# Patient Record
Sex: Male | Born: 1970 | Race: White | Hispanic: No | Marital: Married | Smoking: Current every day smoker
Health system: Southern US, Community
[De-identification: ages and names within clinical notes are randomized; demographics above are authoritative.]

## PROBLEM LIST (undated history)

## (undated) DIAGNOSIS — I10 Essential (primary) hypertension: Secondary | ICD-10-CM

## (undated) HISTORY — PX: TENDON REPAIR: SHX5111

## (undated) HISTORY — DX: Essential (primary) hypertension: I10

## (undated) HISTORY — PX: WISDOM TOOTH EXTRACTION: SHX21

---

## 2020-11-13 ENCOUNTER — Ambulatory Visit: Payer: Self-pay | Admitting: Internal Medicine

## 2020-11-28 ENCOUNTER — Other Ambulatory Visit: Payer: Self-pay

## 2020-11-28 ENCOUNTER — Encounter: Payer: Self-pay | Admitting: Internal Medicine

## 2020-11-28 ENCOUNTER — Ambulatory Visit (INDEPENDENT_AMBULATORY_CARE_PROVIDER_SITE_OTHER): Payer: Self-pay | Admitting: Internal Medicine

## 2020-11-28 DIAGNOSIS — F17200 Nicotine dependence, unspecified, uncomplicated: Secondary | ICD-10-CM

## 2020-12-15 ENCOUNTER — Encounter: Payer: Self-pay | Admitting: Internal Medicine

## 2021-06-11 ENCOUNTER — Telehealth (INDEPENDENT_AMBULATORY_CARE_PROVIDER_SITE_OTHER): Payer: Self-pay | Admitting: Family

## 2021-06-11 ENCOUNTER — Encounter: Payer: Self-pay | Admitting: Family

## 2021-06-11 ENCOUNTER — Ambulatory Visit: Payer: Self-pay | Admitting: Family

## 2021-06-11 VITALS — Ht 69.0 in | Wt 200.0 lb

## 2021-06-11 DIAGNOSIS — R222 Localized swelling, mass and lump, trunk: Secondary | ICD-10-CM

## 2021-06-11 DIAGNOSIS — Z1211 Encounter for screening for malignant neoplasm of colon: Secondary | ICD-10-CM | POA: Insufficient documentation

## 2021-06-11 DIAGNOSIS — K429 Umbilical hernia without obstruction or gangrene: Secondary | ICD-10-CM | POA: Insufficient documentation

## 2021-06-11 DIAGNOSIS — F17210 Nicotine dependence, cigarettes, uncomplicated: Secondary | ICD-10-CM | POA: Insufficient documentation

## 2021-06-11 NOTE — Assessment & Plan Note (Signed)
Avoid heavy straining.  ?Referral to gen surgery. ?Patient education sent to mychart. ?

## 2021-06-11 NOTE — Assessment & Plan Note (Signed)
Referral for GI placed for colonoscopy.  ?

## 2021-06-11 NOTE — Assessment & Plan Note (Signed)
Suspected lipoma, was virtual visit, will assess at physical exam.  ?Pt states no signs of infection, advised to let me know if these begin to occur.  ?

## 2021-06-11 NOTE — Progress Notes (Signed)
? ? ? ?New Patient MyChart Video Visit ? ? ? ?Virtual Visit via Video Note  ? ?This visit type was conducted due to national recommendations for restrictions regarding the COVID-19 Pandemic (e.g. social distancing) in an effort to limit this patient's exposure and mitigate transmission in our community. This patient is at least at moderate risk for complications without adequate follow up. This format is felt to be most appropriate for this patient at this time. Physical exam was limited by quality of the video and audio technology used for the visit. CMA was able to get the patient set up on a video visit. ? ?Patient location: Home. Patient and provider in visit ?Provider location: Office ? ?I discussed the limitations of evaluation and management by telemedicine and the availability of in person appointments. The patient expressed understanding and agreed to proceed. ? ?Visit Date: 06/11/2021 ? ?Today's healthcare provider: Eugenia Pancoast, FNP  ? ? ?Subjective:  ?Patient ID: Marcus Holmes, male    DOB: 1970-11-13  Age: 51 y.o. MRN: [id number] ? ?CC:  ?Chief Complaint  ?Patient presents with  ? Establish Care  ? ? ?HPI ?Marcus Holmes is here to establish care as a new patient via video visit. ? ?Has not had a PCP in the last few years.  ?He recently acquired insurance and lives right across the street.  ?Pt is with acute concerns ? ?Has noticed umbilical hernia, non-tender, and states he is able to push it back in. If he moves around or strains he notices that it pops back out again.  ? ?Also with c/o soft tissue 'mass' on his lower back but doesn't cause him any concern or any pain. He states it feels squishy. Has been there for about six months. No redness, just protrudes. Notices it in the shower.  ? ?Dentist: has been seen in the last one year. Due for f/u for his next renewal.  ? ?Colonoscopy: has never had in the past.   ? ?History reviewed. No pertinent past medical  history. ? ?History reviewed. No pertinent surgical history. ? ?Family History  ?Problem Relation Age of Onset  ? Healthy Mother   ? Lung cancer Father   ?     smoker  ? Heart attack Father 44  ?     poor circulation  ? Healthy Brother   ? Heart disease Paternal Grandfather   ?     cardiomegaly  ? ? ?Social History  ? ?Socioeconomic History  ? Marital status: Married  ?  Spouse name: Marcus Holmes  ? Number of children: 0  ? Years of education: Not on file  ? Highest education level: Not on file  ?Occupational History  ? Occupation: Software engineer  ?Tobacco Use  ? Smoking status: Every Day  ?  Packs/day: 0.50  ?  Years: 35.00  ?  Pack years: 17.50  ?  Types: Cigarettes  ? Smokeless tobacco: Never  ?Vaping Use  ? Vaping Use: Never used  ?Substance and Sexual Activity  ? Alcohol use: Yes  ?  Comment: occasion  ? Drug use: Never  ? Sexual activity: Yes  ?  Partners: Female  ?  Birth control/protection: None  ?Other Topics Concern  ? Not on file  ?Social History Narrative  ? Not on file  ? ?Social Determinants of Health  ? ?Financial Resource Strain: Not on file  ?Food Insecurity: Not on file  ?Transportation Needs: Not on file  ?Physical Activity: Not on file  ?Stress: Not on file  ?  Social Connections: Not on file  ?Intimate Partner Violence: Not on file  ? ? ?No outpatient medications prior to visit.  ? ?No facility-administered medications prior to visit.  ? ? ?No Known Allergies ? ?ROS ?Review of Systems  ?Constitutional:  Negative for chills, fatigue, fever and unexpected weight change.  ?Eyes:  Negative for visual disturbance.  ?Respiratory:  Negative for shortness of breath.   ?Cardiovascular:  Negative for chest pain.  ?Gastrointestinal:  Negative for abdominal pain.  ?Genitourinary:  Negative for difficulty urinating.  ?Skin:  Negative for rash.  ?Neurological:  Negative for dizziness and headaches.  ? ? ?  ?Objective:  ?  ?Physical Exam ?Constitutional:   ?   General: He is not in acute distress. ?   Appearance: Normal  appearance. He is not ill-appearing, toxic-appearing or diaphoretic.  ?HENT:  ?   Head: Normocephalic.  ?Pulmonary:  ?   Effort: Pulmonary effort is normal.  ?Neurological:  ?   General: No focal deficit present.  ?   Mental Status: He is alert and oriented to person, place, and time.  ?Psychiatric:     ?   Mood and Affect: Mood normal.     ?   Behavior: Behavior normal.     ?   Thought Content: Thought content normal.     ?   Judgment: Judgment normal.  ? ? ? ? ?Ht 5' 9"  (1.753 m)   Wt 200 lb (90.7 kg)   BMI 29.53 kg/m?  ?Wt Readings from Last 3 Encounters:  ?06/11/21 200 lb (90.7 kg)  ? ? ? ?Health Maintenance Due  ?Topic Date Due  ? HIV Screening  Never done  ? TETANUS/TDAP  Never done  ? COLONOSCOPY (Pts 45-38yr Insurance coverage will need to be confirmed)  Never done  ? Zoster Vaccines- Shingrix (1 of 2) Never done  ? ? ?There are no preventive care reminders to display for this patient. ? ?No results found for: TSH ?No results found for: WBC, HGB, HCT, MCV, PLT ?No results found for: NA, K, CHLORIDE, CO2, GLUCOSE, BUN, CREATININE, BILITOT, ALKPHOS, AST, ALT, PROT, ALBUMIN, CALCIUM, ANIONGAP, EGFR, GFR ?No results found for: CHOL ?No results found for: HDL ?No results found for: [place]?No results found for: TRIG ?No results found for: CHOLHDL ?No results found for: HGBA1C ? ?  ?Assessment & Plan:  ? ?Problem List Items Addressed This Visit   ? ?  ? Other  ? Umbilical hernia without obstruction and without gangrene - Primary  ?  Avoid heavy straining.  ?Referral to gen surgery. ?Patient education sent to mychart. ?  ?  ? Relevant Orders  ? Ambulatory referral to General Surgery  ? Ambulatory Referral for Lung Cancer Scre  ? Cigarette smoker  ?  Pt does not wish to smoke at this time.  ?Referring to lung cancer screening clinic.  ?  ?  ? Relevant Orders  ? Ambulatory Referral for Lung Cancer Scre  ? Screening for malignant neoplasm of colon  ?  Referral for GI placed for colonoscopy.  ?  ?  ? Relevant  Orders  ? Ambulatory referral to Gastroenterology  ? Mass of subcutaneous tissue of back  ?  Suspected lipoma, was virtual visit, will assess at physical exam.  ?Pt states no signs of infection, advised to let me know if these begin to occur.  ?  ?  ? ? ?No orders of the defined types were placed in this encounter. ? ? ?Follow-up: Return in about  1 month (around 07/12/2021) for follow up for CPE in office, come fasting.  ? ? ?Eugenia Pancoast, FNP ?

## 2021-06-11 NOTE — Assessment & Plan Note (Signed)
Pt does not wish to smoke at this time.  ?Referring to lung cancer screening clinic.  ?

## 2021-06-11 NOTE — Patient Instructions (Addendum)
A referral was placed today for general surgery in regards to your hernia to general surgery, a referral to GI for a colonoscopy as well as a referral to the lung screening clinic for annual CT scans to assess for lung cancer.  ? ?Please let us know if you have not heard back within 1 week about your referral. ? ?Try to avoid any heavy straining with your hernia.  ? ?Please schedule a follow up fasting for your complete physical.  ? ?It was a pleasure seeing you today! Please do not hesitate to reach out with any questions and or concerns. ? ?Regards,  ? ?Ivette Castronova ?FNP-C ? ? ?

## 2021-06-12 ENCOUNTER — Telehealth: Payer: Self-pay

## 2021-06-12 NOTE — Telephone Encounter (Signed)
CALLED PATIENT NO ANSWER LEFT VOICEMAIL FOR A CALL BACK ? ?

## 2021-06-13 ENCOUNTER — Telehealth: Payer: Self-pay

## 2021-06-13 NOTE — Telephone Encounter (Signed)
CALLED PATIENT NO ANSWER LEFT VOICEMAIL FOR A CALL BACK °Letter sent °

## 2021-06-19 ENCOUNTER — Telehealth: Payer: Self-pay

## 2021-06-19 ENCOUNTER — Other Ambulatory Visit: Payer: Self-pay

## 2021-06-19 ENCOUNTER — Ambulatory Visit (INDEPENDENT_AMBULATORY_CARE_PROVIDER_SITE_OTHER): Payer: 59 | Admitting: Surgery

## 2021-06-19 ENCOUNTER — Encounter: Payer: Self-pay | Admitting: Surgery

## 2021-06-19 ENCOUNTER — Ambulatory Visit
Admission: RE | Admit: 2021-06-19 | Discharge: 2021-06-19 | Disposition: A | Discharge status: Home / Self Care | Payer: 59 | Source: Ambulatory Visit | Attending: Student | Admitting: Student

## 2021-06-19 ENCOUNTER — Telehealth: Payer: Self-pay | Admitting: Family

## 2021-06-19 VITALS — BP 218/112 | HR 83 | Temp 98.9°F | Ht 69.0 in | Wt 202.4 lb

## 2021-06-19 VITALS — BP 223/123 | HR 70 | Temp 98.1°F | Resp 18

## 2021-06-19 DIAGNOSIS — K429 Umbilical hernia without obstruction or gangrene: Secondary | ICD-10-CM | POA: Diagnosis not present

## 2021-06-19 DIAGNOSIS — I160 Hypertensive urgency: Secondary | ICD-10-CM

## 2021-06-19 DIAGNOSIS — I10 Essential (primary) hypertension: Secondary | ICD-10-CM

## 2021-06-19 MED ORDER — AMLODIPINE BESYLATE 5 MG PO TABS: 5.0000 mg | ORAL_TABLET | Freq: Every day | ORAL | 0 refills | Status: DC

## 2021-06-19 NOTE — Discharge Instructions (Addendum)
-  Amlodipine one pill daily. Take at the same time every day. ?-Please check your blood pressure at home or at the pharmacy. If this continues to be >140/90, follow-up with your primary care provider for further blood pressure management/ medication titration. If you develop chest pain, shortness of breath, vision changes, the worst headache of your life- head straight to the ED or call 911. ?-Follow-up with PCP in about 2 weeks for recheck. ?-If you develop new symptoms - chest pain, dizziness, worst headache of life, vision changes - head to the ED or call 911. ?

## 2021-06-19 NOTE — ED Provider Notes (Signed)
?[facility] ? ? ? ?CSN: [id number] ?Arrival date & time: 06/19/21  1725 ? ? ?  ? ?History   ?Chief Complaint ?Chief Complaint  ?Patient presents with  ? Hypertension  ? ? ?HPI ?Marcus Holmes is a 51 y.o. male presenting with elevated blood pressure.  He states that he was at his surgeon's office earlier today for preop for umbilical hernia; his blood pressure was noted to be 218/119, and so he was advised to follow-up with his PCP.  They could not see him, so he was then advised to follow-up with urgent care.  He denies any headaches, dizziness, vision changes, shortness of breath. ? ?HPI ? ?History reviewed. No pertinent past medical history. ? ?Patient Active Problem List  ? Diagnosis Date Noted  ? Hypertension 06/19/2021  ? Umbilical hernia without obstruction and without gangrene 06/11/2021  ? Cigarette smoker 06/11/2021  ? Screening for malignant neoplasm of colon 06/11/2021  ? Mass of subcutaneous tissue of back 06/11/2021  ? ? ?History reviewed. No pertinent surgical history. ? ? ? ? ?Home Medications   ? ?Prior to Admission medications   ?Medication Sig Start Date End Date Taking? Authorizing Provider  ?amLODipine (NORVASC) 5 MG tablet Take 1 tablet (5 mg total) by mouth daily. 06/19/21  Yes Hazel Sams, PA-C  ? ? ?Family History ?Family History  ?Problem Relation Age of Onset  ? Healthy Mother   ? Lung cancer Father   ?     smoker  ? Heart attack Father 60  ?     poor circulation  ? Healthy Brother   ? Heart disease Paternal Grandfather   ?     cardiomegaly  ? ? ?Social History ?Social History  ? ?Tobacco Use  ? Smoking status: Every Day  ?  Packs/day: 0.25  ?  Years: 35.00  ?  Pack years: 8.75  ?  Types: Cigarettes  ? Smokeless tobacco: Never  ?Vaping Use  ? Vaping Use: Never used  ?Substance Use Topics  ? Alcohol use: Yes  ?  Comment: occasion  ? Drug use: Never  ? ? ? ?Allergies   ?Patient has no known allergies. ? ? ?Review of Systems ?Review of Systems  ?Neurological:   Negative for dizziness and headaches.  ?All other systems reviewed and are negative. ? ? ?Physical Exam ?Triage Vital Signs ?ED Triage Vitals [06/19/21 1756]  ?Enc Vitals Group  ?   BP (!) 223/123  ?   Pulse Rate 70  ?   Resp 18  ?   Temp 98.1 ?F (36.7 ?C)  ?   Temp Source Oral  ?   SpO2 95 %  ?   Weight   ?   Height   ?   Head Circumference   ?   Peak Flow   ?   Pain Score   ?   Pain Loc   ?   Pain Edu?   ?   Excl. in [place]?   ? ?No data found. ? ?Updated Vital Signs ?BP (!) 223/123 (BP Location: Left Arm)   Pulse 70   Temp 98.1 ?F (36.7 ?C) (Oral)   Resp 18   SpO2 95%  ? ?Visual Acuity ?Right Eye Distance:   ?Left Eye Distance:   ?Bilateral Distance:   ? ?Right Eye Near:   ?Left Eye Near:    ?Bilateral Near:    ? ?Physical Exam ?Vitals reviewed.  ?Constitutional:   ?   Appearance: Normal appearance. He is not  diaphoretic.  ?HENT:  ?   Head: Normocephalic and atraumatic.  ?   Mouth/Throat:  ?   Mouth: Mucous membranes are moist.  ?Eyes:  ?   Extraocular Movements: Extraocular movements intact.  ?   Pupils: Pupils are equal, round, and reactive to light.  ?Cardiovascular:  ?   Rate and Rhythm: Normal rate and regular rhythm.  ?   Pulses:     ?     Radial pulses are 2+ on the right side and 2+ on the left side.  ?   Heart sounds: Normal heart sounds.  ?Pulmonary:  ?   Effort: Pulmonary effort is normal.  ?   Breath sounds: Normal breath sounds.  ?Abdominal:  ?   Palpations: Abdomen is soft.  ?   Tenderness: There is no abdominal tenderness. There is no guarding or rebound.  ?Musculoskeletal:  ?   Right lower leg: No edema.  ?   Left lower leg: No edema.  ?Skin: ?   General: Skin is warm.  ?   Capillary Refill: Capillary refill takes less than 2 seconds.  ?Neurological:  ?   General: No focal deficit present.  ?   Mental Status: He is alert and oriented to person, place, and time.  ?   Comments: CN 2-12 grossly intact.   ?Psychiatric:     ?   Mood and Affect: Mood normal.     ?   Behavior: Behavior normal.     ?    Thought Content: Thought content normal.     ?   Judgment: Judgment normal.  ? ? ? ?UC Treatments / Results  ?Labs ?(all labs ordered are listed, but only abnormal results are displayed) ?Labs Reviewed - No data to display ? ?EKG ? ? ?Radiology ?No results found. ? ?Procedures ?Procedures (including critical care time) ? ?Medications Ordered in UC ?Medications - No data to display ? ?Initial Impression / Assessment and Plan / UC Course  ?I have reviewed the triage vital signs and the nursing notes. ? ?Pertinent labs & imaging results that were available during my care of the patient were reviewed by me and considered in my medical decision making (see chart for details). ? ?  ? ?This patient is a very pleasant 51 y.o. year old male presenting with hypertensive urgency. BP 223/123 at time of visit. Patient had not had medical care of BP screening in >1 year prior to this visit. He is asymptomatic today. Will start him on amlodipine '5mg'$  qd. F/u with PCP in about 2 weeks for recheck and titration. He plans to purchase a BP cuff today. Strict ED return precautions discussed. Patient verbalizes understanding and agreement. -Coding Level 4 for acute exacerbation of chronic condition and prescription drug management. ? ? ?Final Clinical Impressions(s) / UC Diagnoses  ? ?Final diagnoses:  ?Hypertensive urgency  ? ? ? ?Discharge Instructions   ? ?  ?-Amlodipine one pill daily. Take at the same time every day. ?-Please check your blood pressure at home or at the pharmacy. If this continues to be >140/90, follow-up with your primary care provider for further blood pressure management/ medication titration. If you develop chest pain, shortness of breath, vision changes, the worst headache of your life- head straight to the ED or call 911. ?-Follow-up with PCP in about 2 weeks for recheck. ?-If you develop new symptoms - chest pain, dizziness, worst headache of life, vision changes - head to the ED or call 911. ? ? ?ED  Prescriptions   ? ?  Medication Sig Dispense Auth. Provider  ? amLODipine (NORVASC) 5 MG tablet Take 1 tablet (5 mg total) by mouth daily. 30 tablet Hazel Sams, PA-C  ? ?  ? ?PDMP not reviewed this encounter. ?  ?Hazel Sams, PA-C ?06/19/21 1829 ? ?

## 2021-06-19 NOTE — Patient Instructions (Addendum)
Please see your follow up appointment listed below. ?  ?Dr.Tabitha Dugal's office will call you later today.  ?

## 2021-06-19 NOTE — Telephone Encounter (Signed)
I spoke with pt; pt said was at surgeons office discussing an upcoming surgery umbilical hernia that has been scheduled approx one month; pt said no emergency.and BP was 218/118 and before left pt was 190/something pt said was what he called normally nervous at office. No H/A, vision changes,dizziness, CP or SOB  at surgical appt or now. Pt does not have way to ck BP at home. Pt said his pulse now is 70. No available appts at [facility] and T Dugal FNP said pt did need to go to UC today for visit for eval. Pt will keep appt on 07/23/21 for cpx. I scheduled pt an appt at [facility] 06/19/2021 at 5:45. Pt was given UC and ED precautions and Pt voiced understanding and will go to UC this afternoon as scheduled. Sending note to T Dugal FNP and [facility] CMA. ?

## 2021-06-19 NOTE — Telephone Encounter (Signed)
Sheila--[facility] called re: patients blood pressure. ? ?218/118 and she has checked it four different times ? ?Wants the patient to be worked in today ? ?Please f/u with the patient [phone]  ? ?Number for [facility] [phone] ?

## 2021-06-19 NOTE — Telephone Encounter (Signed)
Patient comes to office today for Umbilical hernia- blood pressure 208/126. After resting 215/115 and then 218/112. Placed call to PCP -spoke with Amy receptionist and she stated she would send a message back to the doctor- and that they did not have any openings- I let the patient know Dr.Dugal's office would call him. We discussed the emergency room if PCP could not see him. Patient denies having any symptoms. ?

## 2021-06-19 NOTE — Progress Notes (Signed)
Patient ID: Marcus Holmes, male   DOB: [birth year], 51 y.o.   MRN: [id number] ? ?Chief Complaint: Umbilical hernia ? ?History of Present Illness ?Marcus Holmes is a 51 y.o. male with known umbilical hernia, recently has come more to his attention.  Denies any pain or tenderness.  He reduces it himself.  It seems to recur with times of lifting/activity straining.  He denies any nausea or vomiting, denies any abdominal pain, distention or tenderness.  No prior abdominal surgeries.  He is noted today to have blood pressure on 3 different occasions involving both upper extremities the systolic ranges from 008 to 676 the diastolic ranges from 1 19-5 26.  Denies any chest pain.  His heart rate is regular rate and rhythm ranging from 76-83. ? ?Past Medical History ?History reviewed. No pertinent past medical history.  ? ? ?History reviewed. No pertinent surgical history. ? ?No Known Allergies ? ?No current outpatient medications on file.  ? ?No current facility-administered medications for this visit.  ? ? ?Family History ?Family History  ?Problem Relation Age of Onset  ? Healthy Mother   ? Lung cancer Father   ?     smoker  ? Heart attack Father 86  ?     poor circulation  ? Healthy Brother   ? Heart disease Paternal Grandfather   ?     cardiomegaly  ?  ? ? ?Social History ?Social History  ? ?Tobacco Use  ? Smoking status: Every Day  ?  Packs/day: 0.25  ?  Years: 35.00  ?  Pack years: 8.75  ?  Types: Cigarettes  ? Smokeless tobacco: Never  ?Vaping Use  ? Vaping Use: Never used  ?Substance Use Topics  ? Alcohol use: Yes  ?  Comment: occasion  ? Drug use: Never  ?  ?  ? ? ?Review of Systems  ?Constitutional: Negative.   ?HENT: Negative.    ?Eyes: Negative.   ?Respiratory: Negative.    ?Cardiovascular: Negative.   ?Gastrointestinal: Negative.   ?Genitourinary: Negative.   ?Skin: Negative.   ?Neurological: Negative.   ?Psychiatric/Behavioral: Negative.    ?  ? ?Physical Exam ?Blood pressure (!)  218/112, pulse 83, temperature 98.9 ?F (37.2 ?C), temperature source Oral, height '5\' 9"'$  (1.753 m), weight 202 lb 6.4 oz (91.8 kg), SpO2 97 %. ?Last Weight  Most recent update: 06/19/2021  1:02 PM  ? ? Weight  ?91.8 kg (202 lb 6.4 oz)  ?      ? ?  ? ? ?CONSTITUTIONAL: Well developed, and nourished, appropriately responsive and aware without distress.   ?EYES: Sclera non-icteric.   ?EARS, NOSE, MOUTH AND THROAT: Mask worn.   The oropharynx is clear. Oral mucosa is pink and moist.   Hearing is intact to voice.  ?NECK: Trachea is midline, and there is no jugular venous distension.  ?LYMPH NODES:  Lymph nodes in the neck are not enlarged. ?RESPIRATORY:  Lungs are clear, and breath sounds are equal bilaterally. Normal respiratory effort without pathologic use of accessory muscles. ?CARDIOVASCULAR: Heart is regular in rate and rhythm. ?GI: The abdomen is soft, nontender, and nondistended. There were no palpable masses. I did not appreciate hepatosplenomegaly.  There is a small umbilical fascial defect, with preperitoneal fatty tissue readily reducible.  The defect size is slightly larger than a centimeter. ?MUSCULOSKELETAL:  Symmetrical muscle tone appreciated in all four extremities.    ?SKIN: Skin turgor is normal. No pathologic skin lesions appreciated.  ?NEUROLOGIC:  Motor and sensation appear  grossly normal.  Cranial nerves are grossly without defect. ?PSYCH:  Alert and oriented to person, place and time. Affect is appropriate for situation. ? ?Data Reviewed ?I have personally reviewed what is currently available of the patient's imaging, recent labs and medical records.   ?Labs:  ?No flowsheet data found. ?No flowsheet data found. ? ? ? ?Imaging: ? ?Within last 24 hrs: No results found. ? ?Assessment ?   ? ?Patient Active Problem List  ? Diagnosis Date Noted  ? Umbilical hernia without obstruction and without gangrene 06/11/2021  ? Cigarette smoker 06/11/2021  ? Screening for malignant neoplasm of colon 06/11/2021  ?  Mass of subcutaneous tissue of back 06/11/2021  ? ? ?Plan ?   ?We discussed the role of direct fascial closure with sutures.  I believe a defect of this size is of minimal risk for incarceration/strangulation.  This would require a general anesthetic.  I believe it is best we address his hypertension at this time, and I have also encouraged him to quit smoking. ?We will be glad to see him back in a month anticipating resolution of the above I think based on his plans we will end up deferring any intervention surgically until 30 days or more from now. ? ?Face-to-face time spent with the patient and accompanying care providers(if present) was 30 minutes, with more than 50% of the time spent counseling, educating, and coordinating care of the patient.   ? ?These notes generated with voice recognition software. I apologize for typographical errors. ? ?Ronny Bacon M.D., FACS ?06/19/2021, 2:10 PM ? ? ? ? ?

## 2021-06-19 NOTE — ED Triage Notes (Signed)
Pt was seen at a drs appt and advised his BP was elevated and needed to be seen. Pt denies a history of hypertension. ?

## 2021-06-19 NOTE — Telephone Encounter (Signed)
Please advise 

## 2021-06-19 NOTE — Telephone Encounter (Signed)
Marcus Pancoast, FNP ?to Me  Loreen Freud, CMA   ?   3:59 PM ? Thank you for your recommendations. Will also advise pt to obtain blood pressure cuff at his appt as we will need at home blood pressure monitoring.  ?  ? ? ?I spoke with pt and he said he will purchase a BP cuff and bring BP reading with him to his upcoming appt with Red Christians FNP on 07/23/21. Pt does plan to keep appt today at 5:45 at [place]. FYI to Red Christians FNP. ? ?

## 2021-07-17 ENCOUNTER — Other Ambulatory Visit: Payer: Self-pay | Admitting: Family

## 2021-07-17 ENCOUNTER — Encounter: Payer: Self-pay | Admitting: Family

## 2021-07-17 DIAGNOSIS — I10 Essential (primary) hypertension: Secondary | ICD-10-CM

## 2021-07-17 MED ORDER — AMLODIPINE BESYLATE 5 MG PO TABS: 5.0000 mg | ORAL_TABLET | Freq: Every day | ORAL | 0 refills | Status: DC

## 2021-07-23 ENCOUNTER — Encounter: Payer: Self-pay | Admitting: Family

## 2021-07-23 ENCOUNTER — Other Ambulatory Visit: Payer: Self-pay

## 2021-07-23 ENCOUNTER — Ambulatory Visit (INDEPENDENT_AMBULATORY_CARE_PROVIDER_SITE_OTHER): Payer: 59 | Admitting: Family

## 2021-07-23 VITALS — BP 162/88 | HR 74 | Temp 99.2°F | Resp 16 | Ht 69.0 in | Wt 203.5 lb

## 2021-07-23 DIAGNOSIS — M489 Spondylopathy, unspecified: Secondary | ICD-10-CM

## 2021-07-23 DIAGNOSIS — F17210 Nicotine dependence, cigarettes, uncomplicated: Secondary | ICD-10-CM

## 2021-07-23 DIAGNOSIS — Z13220 Encounter for screening for lipoid disorders: Secondary | ICD-10-CM | POA: Diagnosis not present

## 2021-07-23 DIAGNOSIS — Z23 Encounter for immunization: Secondary | ICD-10-CM

## 2021-07-23 DIAGNOSIS — D1779 Benign lipomatous neoplasm of other sites: Secondary | ICD-10-CM

## 2021-07-23 DIAGNOSIS — Z0001 Encounter for general adult medical examination with abnormal findings: Secondary | ICD-10-CM

## 2021-07-23 DIAGNOSIS — Z8249 Family history of ischemic heart disease and other diseases of the circulatory system: Secondary | ICD-10-CM

## 2021-07-23 DIAGNOSIS — I4510 Unspecified right bundle-branch block: Secondary | ICD-10-CM | POA: Insufficient documentation

## 2021-07-23 DIAGNOSIS — I10 Essential (primary) hypertension: Secondary | ICD-10-CM | POA: Diagnosis not present

## 2021-07-23 DIAGNOSIS — D179 Benign lipomatous neoplasm, unspecified: Secondary | ICD-10-CM | POA: Insufficient documentation

## 2021-07-23 DIAGNOSIS — R222 Localized swelling, mass and lump, trunk: Secondary | ICD-10-CM | POA: Insufficient documentation

## 2021-07-23 DIAGNOSIS — Z1283 Encounter for screening for malignant neoplasm of skin: Secondary | ICD-10-CM | POA: Diagnosis not present

## 2021-07-23 DIAGNOSIS — R21 Rash and other nonspecific skin eruption: Secondary | ICD-10-CM | POA: Diagnosis not present

## 2021-07-23 DIAGNOSIS — Z2882 Immunization not carried out because of caregiver refusal: Secondary | ICD-10-CM

## 2021-07-23 DIAGNOSIS — K429 Umbilical hernia without obstruction or gangrene: Secondary | ICD-10-CM

## 2021-07-23 DIAGNOSIS — Z1211 Encounter for screening for malignant neoplasm of colon: Secondary | ICD-10-CM

## 2021-07-23 LAB — COMPREHENSIVE METABOLIC PANEL
ALT: 19 U/L (ref 0–53)
AST: 16 U/L (ref 0–37)
Albumin: 4.3 g/dL (ref 3.5–5.2)
Alkaline Phosphatase: 57 U/L (ref 39–117)
BUN: 18 mg/dL (ref 6–23)
CO2: 26 mEq/L (ref 19–32)
Calcium: 9.1 mg/dL (ref 8.4–10.5)
Chloride: 106 mEq/L (ref 96–112)
Creatinine, Ser: 0.99 mg/dL (ref 0.40–1.50)
GFR: 88.23 mL/min (ref >60.00)
Glucose, Bld: 127 mg/dL — ABNORMAL HIGH (ref 70–99)
Potassium: 3.9 mEq/L (ref 3.5–5.1)
Sodium: 138 mEq/L (ref 135–145)
Total Bilirubin: 0.4 mg/dL (ref 0.2–1.2)
Total Protein: 7.5 g/dL (ref 6.0–8.3)

## 2021-07-23 LAB — CBC WITH DIFFERENTIAL/PLATELET
Basophils Absolute: 0.1 10*3/uL (ref 0.0–0.1)
Basophils Relative: 0.9 % (ref 0.0–3.0)
Eosinophils Absolute: 0.3 10*3/uL (ref 0.0–0.7)
Eosinophils Relative: 4.5 % (ref 0.0–5.0)
HCT: 43.5 % (ref 39.0–52.0)
Hemoglobin: 14.8 g/dL (ref 13.0–17.0)
Lymphocytes Relative: 20.3 % (ref 12.0–46.0)
Lymphs Abs: 1.5 10*3/uL (ref 0.7–4.0)
MCHC: 34 g/dL (ref 30.0–36.0)
MCV: 86.7 fl (ref 78.0–100.0)
Monocytes Absolute: 0.8 10*3/uL (ref 0.1–1.0)
Monocytes Relative: 11 % (ref 3.0–12.0)
Neutro Abs: 4.6 10*3/uL (ref 1.4–7.7)
Neutrophils Relative %: 63.3 % (ref 43.0–77.0)
Platelets: 286 10*3/uL (ref 150.0–400.0)
RBC: 5.01 Mil/uL (ref 4.22–5.81)
RDW: 13.6 % (ref 11.5–15.5)
WBC: 7.3 10*3/uL (ref 4.0–10.5)

## 2021-07-23 LAB — LIPID PANEL
Cholesterol: 108 mg/dL (ref 0–200)
HDL: 34 mg/dL — ABNORMAL LOW (ref >39.00)
LDL Cholesterol: 46 mg/dL (ref 0–99)
NonHDL: 73.96
Total CHOL/HDL Ratio: 3
Triglycerides: 140 mg/dL (ref 0.0–149.0)
VLDL: 28 mg/dL (ref 0.0–40.0)

## 2021-07-23 LAB — MICROALBUMIN / CREATININE URINE RATIO
Creatinine,U: 63 mg/dL
Microalb Creat Ratio: 3.1 mg/g (ref 0.0–30.0)
Microalb, Ur: 2 mg/dL — ABNORMAL HIGH (ref 0.0–1.9)

## 2021-07-23 MED ORDER — AMLODIPINE BESYLATE 10 MG PO TABS: 10.0000 mg | ORAL_TABLET | Freq: Every day | ORAL | 1 refills | Status: DC

## 2021-07-23 NOTE — Assessment & Plan Note (Signed)
Encouraged again to make appt for colonscopy ?

## 2021-07-23 NOTE — Assessment & Plan Note (Signed)
Pt refused shingrix vaccination ?Will consider in the future ?

## 2021-07-23 NOTE — Assessment & Plan Note (Signed)
Asymptomatic,. Control blood pressure ?Dad's fmh MI, so referring to cardiologist for screening, pt also chronic smoker (working on decreasing) ?

## 2021-07-23 NOTE — Assessment & Plan Note (Signed)
Increase to 10 mg amlodipine ?F/u three weeks with blood pressure log ?

## 2021-07-23 NOTE — Assessment & Plan Note (Signed)
Patient Counseling(The following topics were reviewed): ? Preventative care handout given to pt  ?Health maintenance and immunizations reviewed. Please refer to Health maintenance section. ?Pt advised on safe sex, wearing seatbelts in car, and proper nutrition ?labwork ordered today for annual ?Dental health: Discussed importance of regular tooth brushing, flossing, and dental visits. ? ? ?

## 2021-07-23 NOTE — Assessment & Plan Note (Signed)
Ref to derm

## 2021-07-23 NOTE — Assessment & Plan Note (Signed)
Pt working on cessation, weaning down from daily intake ?

## 2021-07-23 NOTE — Progress Notes (Signed)
? ?Established Patient Office Visit ? ?Subjective:  ?Patient ID: Marcus Holmes, male    DOB: 06/18/1970  Age: 50 y.o. MRN: [id number] ? ?CC:  ?Chief Complaint  ?Patient presents with  ? Establish Care  ? ? ?HPI ?Marcus Holmes is here today for an annual comprehensive exam.  ? ?Pt is without acute concerns.  ? ?Pt is ok with tdap vaccination today.  ?Shingrix vaccination: pt refuses for today but will think about this.  ?Colonoscopy: was given referral 3/6 has yet to make appt will call in soon.  ?Referral for lung cancer screening placed 3/6 pt has not called to make appt.  ? ?Chronic problems addressed today: ? ?Umbilical hernia: able to push back in when pops out. No pain at time. Tried to make appt with general surgeon however blood pressure too high so unable to proceed until this is better controlled. Has appt in May with general surgeon.  ? ?Soft tissue mass on lower back, over the last 6 months to one year. Pretty decent size, no pain. Wife noticed it. No redness no drainage.  ? ?HTN: average stays around 160-180/90 or so. Taking amlodipine 5 mg once daily. No chest pain palpitations or sob. No headaches. No blurry vision.  ? Dad fmh MI age 56 ? ?History reviewed. No pertinent past medical history. ? ?Past Surgical History:  ?Procedure Laterality Date  ? WISDOM TOOTH EXTRACTION Bilateral   ? ? ?Family History  ?Problem Relation Age of Onset  ? Healthy Mother   ? Lung cancer Father   ?     smoker  ? Heart attack Father 56  ?     poor circulation  ? Healthy Brother   ? Heart disease Paternal Grandfather   ?     cardiomegaly  ? ? ?Social History  ? ?Socioeconomic History  ? Marital status: Married  ?  Spouse name: Jennifer  ? Number of children: 0  ? Years of education: Not on file  ? Highest education level: Not on file  ?Occupational History  ? Occupation: butcher  ?  Employer: [employer]  ?Tobacco Use  ? Smoking status: Every Day  ?  Packs/day: 0.25  ?  Years: 35.00  ?  Pack years:  8.75  ?  Types: Cigarettes  ? Smokeless tobacco: Never  ?Vaping Use  ? Vaping Use: Never used  ?Substance and Sexual Activity  ? Alcohol use: Yes  ?  Comment: occasion  ? Drug use: Never  ? Sexual activity: Yes  ?  Partners: Female  ?  Birth control/protection: None  ?Other Topics Concern  ? Not on file  ?Social History Narrative  ? Not on file  ? ?Social Determinants of Health  ? ?Financial Resource Strain: Not on file  ?Food Insecurity: Not on file  ?Transportation Needs: Not on file  ?Physical Activity: Not on file  ?Stress: Not on file  ?Social Connections: Not on file  ?Intimate Partner Violence: Not on file  ? ? ?Outpatient Medications Prior to Visit  ?Medication Sig Dispense Refill  ? amLODipine (NORVASC) 5 MG tablet Take 1 tablet (5 mg total) by mouth daily. 90 tablet 0  ? ?No facility-administered medications prior to visit.  ? ? ?No Known Allergies ? ?ROS ?Review of Systems  ?Constitutional:  Negative for chills, fatigue, fever and unexpected weight change.  ?Eyes:  Negative for visual disturbance.  ?Respiratory:  Negative for shortness of breath.   ?Cardiovascular:  Negative for chest pain.  ?Gastrointestinal:  Negative for   abdominal pain.  ?     Umbilical hernia, nonpainful, can reduce ?  ?Genitourinary:  Negative for difficulty urinating.  ?Skin:  Negative for rash.  ?     Mass nontender mid upper back   ?Neurological:  Negative for dizziness and headaches.  ?Psychiatric/Behavioral:  Negative for self-injury.   ? ? ?  ?Objective:  ?  ?Physical Exam ?Constitutional:   ?   General: He is not in acute distress. ?   Appearance: Normal appearance. He is obese. He is not ill-appearing, toxic-appearing or diaphoretic.  ?HENT:  ?   Head: Normocephalic.  ?   Right Ear: Tympanic membrane normal.  ?   Left Ear: Tympanic membrane normal.  ?Cardiovascular:  ?   Rate and Rhythm: Normal rate and regular rhythm.  ?   Pulses:     ?     Carotid pulses are 2+ on the right side and 2+ on the left side. ?     Radial pulses  are 2+ on the right side and 2+ on the left side.  ?     Femoral pulses are 2+ on the right side and 2+ on the left side. ?     Popliteal pulses are 2+ on the right side and 2+ on the left side.  ?     Dorsalis pedis pulses are 2+ on the right side and 2+ on the left side.  ?     Posterior tibial pulses are 2+ on the right side and 2+ on the left side.  ?Pulmonary:  ?   Effort: Pulmonary effort is normal.  ?   Breath sounds: Normal breath sounds.  ?Abdominal:  ?   General: Abdomen is flat.  ?   Hernia: A hernia (reducible umbilical nontender) is present.  ?Musculoskeletal:  ?   Right lower leg: No edema.  ?   Left lower leg: No edema.  ?Skin: ?   Comments: Boggy to firm raised 5 mm diameter raised mass, suspected lipoma mid upper back ?  ?Neurological:  ?   Mental Status: He is alert.  ? ? ? ? ?BP (!) 162/88   Pulse 74   Temp 99.2 ?F (37.3 ?C)   Resp 16   Ht 5' 9" (1.753 m)   Wt 203 lb 8 oz (92.3 kg)   SpO2 97%   BMI 30.05 kg/m?  ?Wt Readings from Last 3 Encounters:  ?07/23/21 203 lb 8 oz (92.3 kg)  ?06/19/21 202 lb 6.4 oz (91.8 kg)  ?06/11/21 200 lb (90.7 kg)  ? ? ? ?Health Maintenance Due  ?Topic Date Due  ? TETANUS/TDAP  Never done  ? COLONOSCOPY (Pts 45-32yr Insurance coverage will need to be confirmed)  Never done  ? ? ?There are no preventive care reminders to display for this patient. ? ?No results found for: TSH ?No results found for: WBC, HGB, HCT, MCV, PLT ?No results found for: NA, K, CHLORIDE, CO2, GLUCOSE, BUN, CREATININE, BILITOT, ALKPHOS, AST, ALT, PROT, ALBUMIN, CALCIUM, ANIONGAP, EGFR, GFR ?No results found for: CHOL ?No results found for: HDL ?No results found for: LOgemaw?No results found for: TRIG ?No results found for: CHOLHDL ?No results found for: HGBA1C ? ?  ?Assessment & Plan:  ? ?Problem List Items Addressed This Visit   ? ?  ? Cardiovascular and Mediastinum  ? Hypertension  ?  Increase to 10 mg amlodipine ?F/u three weeks with blood pressure log ? ?  ?  ? Relevant Medications  ?  amLODipine (NORVASC)  10 MG tablet  ? Other Relevant Orders  ? EKG 12-Lead (Completed)  ? Comprehensive metabolic panel  ? Microalbumin / creatinine urine ratio  ? Ambulatory referral to Cardiology  ? Incomplete right bundle branch block  ?  Asymptomatic,. Control blood pressure ?Dad's fmh MI, so referring to cardiologist for screening, pt also chronic smoker (working on decreasing) ? ?  ?  ? Relevant Medications  ? amLODipine (NORVASC) 10 MG tablet  ? Other Relevant Orders  ? Ambulatory referral to Cardiology  ?  ? Musculoskeletal and Integument  ? Rash/skin eruption  ?  Suspected rosacea, referring to derm for also need for skin screening ? ?  ?  ? Relevant Orders  ? Ambulatory referral to Dermatology  ? Mass of thoracic vertebra  ?  MRI back, pending auth ? ? ?  ?  ? Relevant Orders  ? MR Thoracic Spine Wo Contrast  ?  ? Other  ? Umbilical hernia without obstruction and without gangrene  ?  D/w pt warning signs ?Currently nontender and reducible ?F/u with gen surg as scheduled, will work on htn ? ?  ?  ? Cigarette smoker  ?  Pt working on cessation, weaning down from daily intake ? ?  ?  ? Screening for malignant neoplasm of colon  ?  Encouraged again to make appt for colonscopy ? ?  ?  ? Screening for skin cancer  ?  Ref to derm ? ?  ?  ? Relevant Orders  ? Ambulatory referral to Dermatology  ? Encounter for vaccination - Primary  ?  tdap vaccine administered in office ?Pt tolerated procedure well  ?Verbal consent obtained prior to administration  ?Handout given in regards to vaccination.  ? ? ?  ?  ? Relevant Orders  ? Tdap vaccine greater than or equal to 7yo IM  ? Family history of MI (myocardial infarction)  ? Relevant Orders  ? Ambulatory referral to Cardiology  ? Encounter for general adult medical examination with abnormal findings  ?  Patient Counseling(The following topics were reviewed): ? Preventative care handout given to pt  ?Health maintenance and immunizations reviewed. Please refer to Health  maintenance section. ?Pt advised on safe sex, wearing seatbelts in car, and proper nutrition ?labwork ordered today for annual ?Dental health: Discussed importance of regular tooth brushing, flossing, and dental vis

## 2021-07-23 NOTE — Assessment & Plan Note (Signed)
MRI back, pending auth ? ?

## 2021-07-23 NOTE — Assessment & Plan Note (Signed)
Suspected rosacea, referring to derm for also need for skin screening ?

## 2021-07-23 NOTE — Assessment & Plan Note (Signed)
tdap vaccine administered in office Pt tolerated procedure well  Verbal consent obtained prior to administration  Handout given in regards to vaccination.   

## 2021-07-23 NOTE — Assessment & Plan Note (Signed)
D/w pt warning signs ?Currently nontender and reducible ?F/u with gen surg as scheduled, will work on htn ?

## 2021-07-23 NOTE — Patient Instructions (Addendum)
Increase amlodipine to 10 mg once daily.  ?Start monitoring your blood pressure daily, around the same time of day, for the next 2-3 weeks.  Ensure that you have rested for 30 minutes prior to checking your blood pressure. Record your readings and bring them to your next visit. ? ?A referral was placed today for dermatology. ?Please let us know if you have not heard back within 2 weeks about the referral. ? ?A referral was placed today for cardiology for strong family history of heart attack as well as abnormal EKG. ?Please let us know if you have not heard back within 2 weeks about the referral. ? ?I have ordered an MRI of your mass on your back.  ?Let me know if you do not hear from [facility] to schedule this in the next few weeks. ? ?Stop by the lab prior to leaving today. I will notify you of your results once received.  ? ?Due to recent changes in healthcare laws, you may see results of your imaging and/or laboratory studies on MyChart before I have had a chance to review them.  I understand that in some cases there may be results that are confusing or concerning to you. Please understand that not all results are received at the same time and often I may need to interpret multiple results in order to provide you with the best plan of care or course of treatment. Therefore, I ask that you please give me 2 business days to thoroughly review all your results before contacting my office for clarification. Should we see a critical lab result, you will be contacted sooner.  ? ?It was a pleasure seeing you today! Please do not hesitate to reach out with any questions and or concerns. ? ?Regards,  ? ?Jessilyn Catino ?FNP-C ? ? ? ? ?

## 2021-07-23 NOTE — Assessment & Plan Note (Signed)
Lipid panel ordered pending results.   

## 2021-07-24 ENCOUNTER — Other Ambulatory Visit: Payer: Self-pay | Admitting: Family

## 2021-07-24 DIAGNOSIS — R739 Hyperglycemia, unspecified: Secondary | ICD-10-CM | POA: Insufficient documentation

## 2021-07-24 DIAGNOSIS — R809 Proteinuria, unspecified: Secondary | ICD-10-CM | POA: Insufficient documentation

## 2021-07-24 MED ORDER — LOSARTAN POTASSIUM 25 MG PO TABS
25.0000 mg | ORAL_TABLET | Freq: Every day | ORAL | 1 refills | Status: DC
Start: 2021-07-24 — End: 2021-10-29

## 2021-07-25 ENCOUNTER — Other Ambulatory Visit (INDEPENDENT_AMBULATORY_CARE_PROVIDER_SITE_OTHER): Payer: 59

## 2021-07-25 DIAGNOSIS — R739 Hyperglycemia, unspecified: Secondary | ICD-10-CM

## 2021-07-25 LAB — HEMOGLOBIN A1C: Hgb A1c MFr Bld: 6.3 % (ref 4.6–6.5)

## 2021-07-26 ENCOUNTER — Encounter: Payer: Self-pay | Admitting: Family

## 2021-07-26 ENCOUNTER — Telehealth: Payer: Self-pay

## 2021-07-26 ENCOUNTER — Ambulatory Visit: Payer: Self-pay | Admitting: Surgery

## 2021-07-26 NOTE — Telephone Encounter (Signed)
[facility] - Client ?Nonclinical Telephone Record  ?[facility]? ?Client [facility] - Client ?Client [facility] [place] ?Provider AA - PHYSICIAN, UNKNOWN- MD ?Contact Type Call ?Who Is Calling Patient / Member / Family / Caregiver ?Caller Name Rayquan Amrhein ?Caller Phone Number [phone] ?Patient Name na ?Patient DOB na ?Call Type Message Only Information Provided ?Reason for Call Request for Lab/Test Results ?Initial Comment Caller states he received a call earlier, and he is returning a call regarding his test scores. ?Disp. Time Disposition Final User ?07/25/2021 6:26:15 PM General Information Provided Yes Socrates, Janett Billow ?Call Closed By: Janett Billow Socrates ?Transaction Date/Time: 07/25/2021 6:23:08 PM (ET ?

## 2021-07-26 NOTE — Telephone Encounter (Signed)
Pt called back and I proved him with his lab result. Pt said to send Losartan to [facility]. ?

## 2021-07-26 NOTE — Telephone Encounter (Signed)
Losartan sent to pharmacy 4/18. Sent pt [employer] w info. ?

## 2021-08-07 ENCOUNTER — Other Ambulatory Visit: Payer: Self-pay | Admitting: Family

## 2021-08-07 ENCOUNTER — Ambulatory Visit (INDEPENDENT_AMBULATORY_CARE_PROVIDER_SITE_OTHER)
Admission: RE | Admit: 2021-08-07 | Discharge: 2021-08-07 | Disposition: A | Discharge status: Home / Self Care | Payer: 59 | Source: Ambulatory Visit | Attending: Family | Admitting: Family

## 2021-08-07 ENCOUNTER — Ambulatory Visit: Payer: 59 | Admitting: Surgery

## 2021-08-07 ENCOUNTER — Telehealth: Payer: Self-pay

## 2021-08-07 ENCOUNTER — Encounter: Payer: Self-pay | Admitting: Surgery

## 2021-08-07 VITALS — BP 181/109 | HR 72 | Temp 97.9°F | Ht 69.0 in | Wt 205.0 lb

## 2021-08-07 DIAGNOSIS — I10 Essential (primary) hypertension: Secondary | ICD-10-CM

## 2021-08-07 DIAGNOSIS — K429 Umbilical hernia without obstruction or gangrene: Secondary | ICD-10-CM | POA: Diagnosis not present

## 2021-08-07 DIAGNOSIS — R222 Localized swelling, mass and lump, trunk: Secondary | ICD-10-CM | POA: Diagnosis not present

## 2021-08-07 NOTE — Telephone Encounter (Signed)
Pt is coming in today for the xray of the back. ? ?

## 2021-08-07 NOTE — Progress Notes (Signed)
Surgical Clinic Progress/Follow-up Note  ? ?HPI:  ?51 y.o. Male presents to clinic for umbilical hernia follow-up.Patient reports progress and improvement of prior hypertensive issues and has been engaged in a work-up by his primary care.  Tolerating regular diet with +flatus and normal BM's, denies N/V, fever/chills, CP, or SOB.  He denies any umbilical hernia pain, the small bulge is reduced easily, and without significant tenderness.  It does not affect any of his daily activities. ? ?Review of Systems:  ?Constitutional: denies fever/chills  ?ENT: denies sore throat, hearing problems  ?Respiratory: denies shortness of breath, wheezing  ?Cardiovascular: denies chest pain, palpitations  ?Gastrointestinal: denies abdominal pain, N/V, or diarrhea/and bowel function as per interval history ?Skin: Denies any other rashes or skin discolorations as per interval history ? ?Vital Signs:  ?BP (!) 181/109   Pulse 72   Temp 97.9 ?F (36.6 ?C) (Oral)   Ht '5\' 9"'$  (1.753 m)   Wt 205 lb (93 kg)   SpO2 95%   BMI 30.27 kg/m?   ? ?Physical Exam:  ?Constitutional:  ?-- Normal body habitus  ?-- Awake, alert, and oriented x3  ?Pulmonary:  ?-- No crackles ?-- Equal breath sounds bilaterally ?-- Breathing non-labored at rest ?Cardiovascular:  ?-- S1, S2 present  ?-- No pericardial rubs  ?Gastrointestinal:  ?-- Soft and non-distended, non-tender, small umbilical bulge, easily reducible, centimeter sized fascial defect if that. ?Musculoskeletal / Integumentary:  ?-- Wounds or skin discoloration: None appreciated  ?-- Extremities: B/L UE and LE FROM, hands and feet warm, no edema  ? ?Laboratory studies:  ? ?Imaging: No new pertinent imaging available for review ? ? ?Assessment:  ?51 y.o. yo Male with a problem list including...  ?Patient Active Problem List  ? Diagnosis Date Noted  ? Hyperglycemia 07/24/2021  ? Positive for microalbuminuria 07/24/2021  ? Screening for skin cancer 07/23/2021  ? Encounter for vaccination 07/23/2021  ?  Rash/skin eruption 07/23/2021  ? Family history of MI (myocardial infarction) 07/23/2021  ? Encounter for general adult medical examination with abnormal findings 07/23/2021  ? Incomplete right bundle branch block 07/23/2021  ? Mass of thoracic vertebra 07/23/2021  ? Palpable mass of lower back 07/23/2021  ? 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 Screening for lipoid disorders 07/23/2021  ? Vaccination refused by parent 07/23/2021  ? Hypertension 06/19/2021  ? Umbilical hernia without obstruction and without gangrene 06/11/2021  ? Cigarette smoker 06/11/2021  ? Screening for malignant neoplasm of colon 06/11/2021  ? Mass of subcutaneous tissue of back 06/11/2021  ?  ?presents to clinic for follow-up evaluation of rather asymptomatic umbilical hernia, doing well.  And attending to the work-up for his hypertension and cardiovascular status. ? ?Plan:  ?            - return to clinic as needed, instructed to call office if any questions or concerns ?Once again we reviewed that this hernia repair would be entirely elective and as long as it is relatively asymptomatic, we have the luxury of deferring it until all other questions concerns regarding his health have been fully evaluated and resolved.  I believe he is in agreement and understands and appreciates the fact that we can pursue this should he have additional symptoms, and of course attending to priorities and his other aspects of healthcare. ?All of the above recommendations were discussed with the patient, and all of patient's questions were answered to his expressed satisfaction. ? ?These notes generated with voice recognition software. I apologize for typographical errors. ? ?Sharlet Salina  Christian Mate, MD, FACS ?[facility] ?General Surgery - Partnering for exceptional care. ?Office: [phone]  ?

## 2021-08-07 NOTE — Patient Instructions (Signed)
If you have any concerns or questions, please feel free to call our office. Follow up as needed.  ? ?Umbilical Hernia, Adult ? ?A hernia is a bulge of tissue that pushes through an opening between muscles. An umbilical hernia happens in the abdomen, near the belly button (umbilicus). The hernia may contain tissues from the small intestine, large intestine, or fatty tissue covering the intestines. Umbilical hernias in adults tend to get worse over time, and they require surgical treatment. ?There are different types of umbilical hernias, including: ?Indirect hernia. This type is located just above or below the umbilicus. It is the most common type of umbilical hernia in adults. ?Direct hernia. This type forms through an opening formed by the umbilicus. ?Reducible hernia. This type of hernia comes and goes. It may be visible only when you strain, lift something heavy, or cough. This type of hernia can be pushed back into the abdomen (reduced). ?Incarcerated hernia. This type traps abdominal tissue inside the hernia. This type of hernia cannot be reduced. ?Strangulated hernia. This type of hernia cuts off blood flow to the tissues inside the hernia. The tissues can start to die if this happens. This type of hernia requires emergency treatment. ?What are the causes? ?An umbilical hernia happens when tissue inside the abdomen presses on a weak area of the abdominal muscles. ?What increases the risk? ?You may have a greater risk of this condition if you: ?Are obese. ?Have had several pregnancies. ?Have a buildup of fluid inside your abdomen. ?Have had surgery that weakens the abdominal muscles. ?What are the signs or symptoms? ?The main symptom of this condition is a painless bulge at or near the belly button. ?A reducible hernia may be visible only when you strain, lift something heavy, or cough. Other symptoms may include: ?Dull pain. ?A feeling of pressure. ?Symptoms of a strangulated hernia may include: ?Pain that  gets increasingly worse. ?Nausea and vomiting. ?Pain when pressing on the hernia. ?Skin over the hernia becoming red or purple. ?Constipation. ?Blood in the stool. ?How is this diagnosed? ?This condition may be diagnosed based on: ?A physical exam. You may be asked to cough or strain while standing. These actions increase the pressure inside your abdomen and can force the hernia through the opening in your muscles. Your health care provider may try to reduce the hernia by pressing on it. ?Your symptoms and medical history. ?How is this treated? ?Surgery is the only treatment for an umbilical hernia. Surgery for a strangulated hernia is done as soon as possible. If you have a small hernia that is not incarcerated, you may need to lose weight before having surgery. ?Follow these instructions at home: ?Lose weight, if told by your health care provider. ?Do not try to push the hernia back in. ?Watch your hernia for any changes in color or size. Tell your health care provider if any changes occur. ?You may need to avoid activities that increase pressure on your hernia. ?Do not lift anything that is heavier than 10 lb (4.5 kg), or the limit that you are told, until your health care provider says that it is safe. ?Take over-the-counter and prescription medicines only as told by your health care provider. ?Keep all follow-up visits. This is important. ?Contact a health care provider if: ?Your hernia gets larger. ?Your hernia becomes painful. ?Get help right away if: ?You develop sudden, severe pain near the area of your hernia. ?You have pain as well as nausea or vomiting. ?You have pain and  the skin over your hernia changes color. ?You develop a fever or chills. ?Summary ?A hernia is a bulge of tissue that pushes through an opening between muscles. An umbilical hernia happens near the belly button. ?Surgery is the only treatment for an umbilical hernia. ?Do not try to push your hernia back in. ?Keep all follow-up visits.  This is important. ?This information is not intended to replace advice given to you by your health care provider. Make sure you discuss any questions you have with your health care provider. ?Document Revised: 11/01/2019 Document Reviewed: 11/01/2019 ?Elsevier Patient Education ? [place]. ? ?

## 2021-08-08 NOTE — Progress Notes (Signed)
As expected soft tissue mass not visualized on xray. Can we see if we can get mri ordered ?

## 2021-08-27 ENCOUNTER — Ambulatory Visit: Payer: 59 | Admitting: Cardiology

## 2021-08-27 ENCOUNTER — Encounter: Payer: Self-pay | Admitting: Cardiology

## 2021-08-27 VITALS — BP 148/88 | HR 72 | Ht 68.5 in | Wt 203.0 lb

## 2021-08-27 DIAGNOSIS — I10 Essential (primary) hypertension: Secondary | ICD-10-CM | POA: Diagnosis not present

## 2021-08-27 DIAGNOSIS — F17200 Nicotine dependence, unspecified, uncomplicated: Secondary | ICD-10-CM | POA: Diagnosis not present

## 2021-08-27 DIAGNOSIS — R9431 Abnormal electrocardiogram [ECG] [EKG]: Secondary | ICD-10-CM

## 2021-08-27 NOTE — Patient Instructions (Signed)
Medication Instructions:  Your physician recommends that you continue on your current medications as directed. Please refer to the Current Medication list given to you today.  *If you need a refill on your cardiac medications before your next appointment, please call your pharmacy*   Lab Work: None ordered If you have labs (blood work) drawn today and your tests are completely normal, you will receive your results only by: [facility] (if you have MyChart) OR A paper copy in the mail If you have any lab test that is abnormal or we need to change your treatment, we will call you to review the results.   Testing/Procedures: None ordered   Follow-Up: At [facility], you and your health needs are our priority.  As part of our continuing mission to provide you with exceptional heart care, we have created designated Provider Care Teams.  These Care Teams include your primary Cardiologist (physician) and Advanced Practice Providers (APPs -  Physician Assistants and Nurse Practitioners) who all work together to provide you with the care you need, when you need it.  We recommend signing up for the patient portal called "MyChart".  Sign up information is provided on this After Visit Summary.  MyChart is used to connect with patients for Virtual Visits (Telemedicine).  Patients are able to view lab/test results, encounter notes, upcoming appointments, etc.  Non-urgent messages can be sent to your provider as well.   To learn more about what you can do with MyChart, go to [email or web address].    Your next appointment:   2 month(s)  The format for your next appointment:   In Person  Provider:   You may see Kate Sable, MD or one of the following Advanced Practice Providers on your designated Care Team:   Murray Hodgkins, NP Christell Faith, PA-C Cadence Kathlen Mody, [place]    Other Instructions   Important Information About [place]

## 2021-08-27 NOTE — Progress Notes (Signed)
Cardiology Office Note:    Date:  08/27/2021   ID:  Marcus Holmes, DOB 1971-03-26, MRN [id number]  PCP:  Eugenia Pancoast, [facility] Providers Cardiologist:  None     Referring MD: Eugenia Pancoast, FNP   Chief Complaint  Patient presents with   NEW patient-Referred by PCP for cardiac eval    HTN/RBBB   Marcus Holmes is a 51 y.o. male who is being seen today for the evaluation of hypertension at the request of Eugenia Pancoast, [place].   History of Present Illness:    Marcus Holmes is a 51 y.o. male with a hx of hypertension, current smoker x25+ years who presents due to difficult to control blood pressures.  Patient was seen for preop eval prior to umbilical hernia surgery.  Blood pressure obtain noted to be elevated with systolics in the 564P.  Was started on amlodipine and losartan, titrated to current doses of amlodipine 10 mg, losartan 25 mg daily.  He states blood pressures have been improving since then, a month ago average was 160s, current blood pressures average 329J to 188C systolic.  He is working on cutting back on his salt, he still smokes, is working on quitting.  Denies chest pain, shortness of breath.  Denies any personal history of heart attacks or heart disease.  Saw PCP 07/23/2021 for scheduled, EKG obtained showed sinus rhythm with incomplete right bundle branch block.  History reviewed. No pertinent past medical history.  Past Surgical History:  Procedure Laterality Date   TENDON REPAIR Left    hand-injury as a child   WISDOM TOOTH EXTRACTION Bilateral     Current Medications: Current Meds  Medication Sig   amLODipine (NORVASC) 10 MG tablet Take 1 tablet (10 mg total) by mouth daily.   losartan (COZAAR) 25 MG tablet Take 1 tablet (25 mg total) by mouth daily.     Allergies:   Patient has no known allergies.   Social History   Socioeconomic History   Marital status: Married    Spouse name:  [name]   Number of children: 0   Years of education: Not on file   Highest education level: Not on file  Occupational History   Occupation: Estate manager/land agent: [employer]  Tobacco Use   Smoking status: Every Day    Packs/day: 0.25    Years: 35.00    Pack years: 8.75    Types: Cigarettes   Smokeless tobacco: Never  Vaping Use   Vaping Use: Never used  Substance and Sexual Activity   Alcohol use: Yes    Comment: occasion   Drug use: Never   Sexual activity: Yes    Partners: Female    Birth control/protection: None  Other Topics Concern   Not on file  Social History Narrative   Not on file   Social Determinants of Health   Financial Resource Strain: Not on file  Food Insecurity: Not on file  Transportation Needs: Not on file  Physical Activity: Not on file  Stress: Not on file  Social Connections: Not on file     Family History: The patient's family history includes Healthy in his brother and mother; Heart attack (age of onset: 35) in his father; Heart disease in his paternal grandfather; Lung cancer in his father.  ROS:   Please see the history of present illness.     All other systems reviewed and are negative.  EKGs/Labs/Other Studies Reviewed:    The following studies were  reviewed today:   EKG:  EKG is  ordered today.  The ekg ordered today demonstrates sinus rhythm, right bundle branch block  Recent Labs: 07/23/2021: ALT 19; BUN 18; Creatinine, Ser 0.99; Hemoglobin 14.8; Platelets 286.0; Potassium 3.9; Sodium 138  Recent Lipid Panel    Component Value Date/Time   CHOL 108 07/23/2021 0857   TRIG 140.0 07/23/2021 0857   HDL 34.00 (L) 07/23/2021 0857   CHOLHDL 3 07/23/2021 0857   VLDL 28.0 07/23/2021 0857   LDLCALC 46 07/23/2021 0857     Risk Assessment/Calculations:          Physical Exam:    VS:  BP (!) 148/88 (BP Location: Right Arm, Patient Position: Sitting, Cuff Size: Normal)   Pulse 72   Ht 5' 8.5" (1.74 m)   Wt 203 lb (92.1 kg)   SpO2  98%   BMI 30.42 kg/m     Wt Readings from Last 3 Encounters:  08/27/21 203 lb (92.1 kg)  08/07/21 205 lb (93 kg)  07/23/21 203 lb 8 oz (92.3 kg)     GEN:  Well nourished, well developed in no acute distress HEENT: Normal NECK: No JVD; No carotid bruits LYMPHATICS: No lymphadenopathy CARDIAC: RRR, no murmurs, rubs, gallops RESPIRATORY:  Clear to auscultation without rales, wheezing or rhonchi  ABDOMEN: Soft, non-tender, non-distended MUSCULOSKELETAL:  No edema; No deformity  SKIN: Warm and dry NEUROLOGIC:  Alert and oriented x 3 PSYCHIATRIC:  Normal affect   ASSESSMENT:    1. Primary hypertension   2. Smoking   3. EKG abnormality    PLAN:    In order of problems listed above:  Hypertension, BP elevated but improving.  Continue Norvasc, losartan 25 mg daily.  Recheck BP in about 2 months since values are downtrending.  If he plateaus, plan to titrate losartan. Current smoker, cessation advised. EKG showing incomplete right bundle, not clinically significant, QRS duration normal.  Patient made aware, reassured.  Follow-up in 2 to 3 months for medication titration.        Medication Adjustments/Labs and Tests Ordered: Current medicines are reviewed at length with the patient today.  Concerns regarding medicines are outlined above.  Orders Placed This Encounter  Procedures   EKG 12-Lead   No orders of the defined types were placed in this encounter.   Patient Instructions  Medication Instructions:  Your physician recommends that you continue on your current medications as directed. Please refer to the Current Medication list given to you today.  *If you need a refill on your cardiac medications before your next appointment, please call your pharmacy*   Lab Work: None ordered If you have labs (blood work) drawn today and your tests are completely normal, you will receive your results only by: [place] (if you have MyChart) OR A paper copy in the mail If  you have any lab test that is abnormal or we need to change your treatment, we will call you to review the results.   Testing/Procedures: None ordered   Follow-Up: At [facility], you and your health needs are our priority.  As part of our continuing mission to provide you with exceptional heart care, we have created designated Provider Care Teams.  These Care Teams include your primary Cardiologist (physician) and Advanced Practice Providers (APPs -  Physician Assistants and Nurse Practitioners) who all work together to provide you with the care you need, when you need it.  We recommend signing up for the patient portal called "MyChart".  Sign  up information is provided on this After Visit Summary.  MyChart is used to connect with patients for Virtual Visits (Telemedicine).  Patients are able to view lab/test results, encounter notes, upcoming appointments, etc.  Non-urgent messages can be sent to your provider as well.   To learn more about what you can do with MyChart, go to [email or web address].    Your next appointment:   2 month(s)  The format for your next appointment:   In Person  Provider:   You may see Kate Sable, MD or one of the following Advanced Practice Providers on your designated Care Team:   Murray Hodgkins, NP Christell Faith, PA-C Cadence Kathlen Mody, [place]    Other Instructions   Important Information About Sugar         Signed, Kate Sable, MD  08/27/2021 12:45 PM    [place]

## 2021-10-08 ENCOUNTER — Other Ambulatory Visit: Payer: Self-pay | Admitting: Family

## 2021-10-08 DIAGNOSIS — I10 Essential (primary) hypertension: Secondary | ICD-10-CM

## 2021-10-25 NOTE — Progress Notes (Unsigned)
Cardiology Office Note    Date:  10/29/2021   ID:  Marcus Holmes, DOB 1970/06/16, MRN [id number]  PCP:  Eugenia Pancoast, FNP  Cardiologist:  Kate Sable, MD  Electrophysiologist:  None   Chief Complaint: Follow-up  History of Present Illness:   Marcus Holmes is a 51 y.o. male with history of HTN, ongoing tobacco use, and RBBB who presents for follow-up of HTN.  He was evaluated by Dr. Garen Lah as a new patient in 08/2021 at the request of his PCP for hypertension and right bundle branch block.  At that time, it was noted he had been evaluated preoperatively for an umbilical hernia repair with blood pressure noted to be elevated in the 200s.  He was started on amlodipine, which was subsequently titrated and losartan with noted improvement in BP readings ultimately to the 409W to 119J systolic.  He had also been cutting back on his salt.  He did continue to smoke, though is working on quitting.  He was without symptoms of angina or decompensation.    No prior cardiac imaging available for review.  He comes in doing well from a cardiac perspective, and is without symptoms of angina or decompensation.  He has tolerated amlodipine and losartan without significant adverse effect.  No significant dyspnea, presyncope, or syncope.  He has had some intermittent dizzy episodes that are short-lived.  Blood pressure has improved typically to the 140s over 90s at home.  He continues to be active and watch his salt intake.  He has cut back on tobacco use.  He will have a couple of beers and maybe a mixed drink on days he does not work.  No significant lower extremity swelling.  No symptoms concerning for sleep apnea.   Labs independently reviewed: 07/2021 - A1c 6.3, Hgb 14.8, PLT 286, TC 108, TG 140, HDL 34, LDL 46, potassium 3.9, BUN 18, serum creatinine 0.99, albumin 4.3, AST/ALT normal  History reviewed. No pertinent past medical history.  Past Surgical  History:  Procedure Laterality Date   TENDON REPAIR Left    hand-injury as a child   WISDOM TOOTH EXTRACTION Bilateral     Current Medications: Current Meds  Medication Sig   amLODipine (NORVASC) 10 MG tablet Take 1 tablet (10 mg total) by mouth daily.   losartan (COZAAR) 50 MG tablet Take 1 tablet (50 mg total) by mouth daily.   [DISCONTINUED] losartan (COZAAR) 25 MG tablet Take 1 tablet (25 mg total) by mouth daily.    Allergies:   Patient has no known allergies.   Social History   Socioeconomic History   Marital status: Married    Spouse name: Marcus Holmes   Number of children: 0   Years of education: Not on file   Highest education level: Not on file  Occupational History   Occupation: Estate manager/land agent: [employer]  Tobacco Use   Smoking status: Every Day    Packs/day: 0.25    Years: 35.00    Total pack years: 8.75    Types: Cigarettes   Smokeless tobacco: Never  Vaping Use   Vaping Use: Never used  Substance and Sexual Activity   Alcohol use: Yes    Comment: occasion   Drug use: Never   Sexual activity: Yes    Partners: Female    Birth control/protection: None  Other Topics Concern   Not on file  Social History Narrative   Not on file   Social Determinants of Health   Financial  Resource Strain: Not on file  Food Insecurity: Not on file  Transportation Needs: Not on file  Physical Activity: Not on file  Stress: Not on file  Social Connections: Not on file     Family History:  The patient's family history includes Healthy in his brother and mother; Heart attack (age of onset: 57) in his father; Heart disease in his paternal grandfather; Lung cancer in his father.  ROS:   12-point review of systems is negative unless otherwise noted in the HPI.   EKGs/Labs/Other Studies Reviewed:    Studies reviewed were summarized above. The additional studies were reviewed today: None available for review.  EKG:  EKG is not ordered today.    Recent Labs: 07/23/2021:  ALT 19; BUN 18; Creatinine, Ser 0.99; Hemoglobin 14.8; Platelets 286.0; Potassium 3.9; Sodium 138  Recent Lipid Panel    Component Value Date/Time   CHOL 108 07/23/2021 0857   TRIG 140.0 07/23/2021 0857   HDL 34.00 (L) 07/23/2021 0857   CHOLHDL 3 07/23/2021 0857   VLDL 28.0 07/23/2021 0857   LDLCALC 46 07/23/2021 0857    PHYSICAL EXAM:    VS:  BP 140/70 (BP Location: Left Arm, Patient Position: Sitting, Cuff Size: Normal)   Pulse 77   Ht '5\' 9"'$  (1.753 m)   Wt 203 lb 2 oz (92.1 kg)   SpO2 98%   BMI 30.00 kg/m   BMI: Body mass index is 30 kg/m.  Physical Exam Vitals reviewed.  Constitutional:      Appearance: He is well-developed.  HENT:     Head: Normocephalic and atraumatic.  Eyes:     General:        Right eye: No discharge.        Left eye: No discharge.  Cardiovascular:     Rate and Rhythm: Normal rate and regular rhythm.     Pulses:          Posterior tibial pulses are 2+ on the right side and 2+ on the left side.     Heart sounds: Normal heart sounds, S1 normal and S2 normal. Heart sounds not distant. No midsystolic click and no opening snap. No murmur heard.    No friction rub.  Pulmonary:     Effort: Pulmonary effort is normal. No respiratory distress.     Breath sounds: Normal breath sounds. No decreased breath sounds, wheezing or rales.  Chest:     Chest wall: No tenderness.  Abdominal:     General: There is no distension.  Musculoskeletal:     Cervical back: Normal range of motion.     Right lower leg: No edema.     Left lower leg: No edema.  Skin:    General: Skin is warm and dry.     Nails: There is no clubbing.  Neurological:     Mental Status: He is alert and oriented to person, place, and time.  Psychiatric:        Speech: Speech normal.        Behavior: Behavior normal.        Thought Content: Thought content normal.        Judgment: Judgment normal.     Wt Readings from Last 3 Encounters:  10/29/21 203 lb 2 oz (92.1 kg)  08/27/21 203 lb  (92.1 kg)  08/07/21 205 lb (93 kg)     ASSESSMENT & PLAN:   HTN: Blood pressure has improved, though remains elevated.  Titrate losartan to 50 mg daily.  Continue  amlodipine 10 mg daily.  Check BMP on ARB.  Would consider echo and follow-up as outlined below.  Low-sodium diet and smoking cessation along with limitation of alcohol are encouraged.  RBBB: Consider echo to evaluate for any structural abnormalities in follow-up.  Tobacco use: Complete cessation is encouraged.   Disposition: F/u with Dr. Garen Lah or an APP in 2 months.   Medication Adjustments/Labs and Tests Ordered: Current medicines are reviewed at length with the patient today.  Concerns regarding medicines are outlined above. Medication changes, Labs and Tests ordered today are summarized above and listed in the Patient Instructions accessible in Encounters.   Signed, Christell Faith, PA-C 10/29/2021 9:38 AM     [facility] [address] [place], [place] [address] [phone]

## 2021-10-29 ENCOUNTER — Ambulatory Visit: Payer: 59 | Admitting: Physician Assistant

## 2021-10-29 ENCOUNTER — Encounter: Payer: Self-pay | Admitting: Physician Assistant

## 2021-10-29 ENCOUNTER — Other Ambulatory Visit
Admission: RE | Admit: 2021-10-29 | Discharge: 2021-10-29 | Disposition: A | Discharge status: Home / Self Care | Payer: 59 | Attending: Physician Assistant | Admitting: Physician Assistant

## 2021-10-29 VITALS — BP 140/70 | HR 77 | Ht 69.0 in | Wt 203.1 lb

## 2021-10-29 DIAGNOSIS — I4510 Unspecified right bundle-branch block: Secondary | ICD-10-CM | POA: Diagnosis not present

## 2021-10-29 DIAGNOSIS — I10 Essential (primary) hypertension: Secondary | ICD-10-CM | POA: Diagnosis not present

## 2021-10-29 DIAGNOSIS — Z720 Tobacco use: Secondary | ICD-10-CM

## 2021-10-29 LAB — BASIC METABOLIC PANEL
Anion gap: 6 (ref 5–15)
BUN: 15 mg/dL (ref 6–20)
CO2: 27 mmol/L (ref 22–32)
Calcium: 9.1 mg/dL (ref 8.9–10.3)
Chloride: 108 mmol/L (ref 98–111)
Creatinine, Ser: 1.06 mg/dL (ref 0.61–1.24)
GFR, Estimated: >60 mL/min (ref >60)
Glucose, Bld: 129 mg/dL — ABNORMAL HIGH (ref 70–99)
Potassium: 4.1 mmol/L (ref 3.5–5.1)
Sodium: 141 mmol/L (ref 135–145)

## 2021-10-29 MED ORDER — LOSARTAN POTASSIUM 50 MG PO TABS: 50.0000 mg | ORAL_TABLET | Freq: Every day | ORAL | 3 refills | Status: DC

## 2021-10-29 NOTE — Patient Instructions (Signed)
Medication Instructions:  Your physician has recommended you make the following change in your medication:   INCREASE Losartan to 50 mg once daily  *If you need a refill on your cardiac medications before your next appointment, please call your pharmacy*   Lab Work: BMET today over at the [facility] entrance and check in at registration.    If you have labs (blood work) drawn today and your tests are completely normal, you will receive your results only by: [place] (if you have MyChart) OR A paper copy in the mail If you have any lab test that is abnormal or we need to change your treatment, we will call you to review the results.   Testing/Procedures: None   Follow-Up: At [facility], you and your health needs are our priority.  As part of our continuing mission to provide you with exceptional heart care, we have created designated Provider Care Teams.  These Care Teams include your primary Cardiologist (physician) and Advanced Practice Providers (APPs -  Physician Assistants and Nurse Practitioners) who all work together to provide you with the care you need, when you need it.   Your next appointment:   2 month(s)  The format for your next appointment:   In Person  Provider:   Kate Sable, MD or Christell Faith, PA-C       Important Information About Sugar

## 2021-11-16 ENCOUNTER — Encounter: Payer: Self-pay | Admitting: *Deleted

## 2021-12-24 ENCOUNTER — Telehealth: Payer: Self-pay

## 2021-12-24 NOTE — Telephone Encounter (Signed)
Called and informed pt to call and schedule his MRI

## 2021-12-28 NOTE — Progress Notes (Unsigned)
Cardiology Office Note    Date:  12/31/2021   ID:  Marcus Holmes, DOB 1970/08/17, MRN [id number]  PCP:  Eugenia Pancoast, FNP  Cardiologist:  Kate Sable, MD  Electrophysiologist:  None   Chief Complaint: Follow-up  History of Present Illness:   Marcus Holmes is a 51 y.o. male with history of HTN, ongoing tobacco use, and RBBB who presents for follow-up of HTN.   He was evaluated by Dr. Garen Lah as a new patient in 08/2021 at the request of his PCP for hypertension and right bundle branch block.  At that time, it was noted he had been evaluated preoperatively for an umbilical hernia repair with blood pressure noted to be elevated in the 200s.  He was started on amlodipine, which was subsequently titrated and losartan with noted improvement in BP readings ultimately to the 353G to 992E systolic.  He had also been cutting back on his salt.  He did continue to smoke, though is working on quitting.  He was without symptoms of angina or decompensation.   No prior cardiac imaging available for review.  He was last seen in the office in 10/2021 and was without symptoms of angina or decompensation.  Blood pressures were typically in the 140s over 90s.  Losartan was titrated to 50 mg daily with continuation of amlodipine.  He comes in doing well from a cardiac perspective, and is without symptoms of angina or decompensation.  Home BP readings from his brachial BP cuff are largely in the 268T systolic.  He does have a watch that checks his blood pressure with readings in the 1 teens to 120s, though he suspects his brachial BP cuff is more accurate and is more aligned with office BP readings.  He continues to watch his salt intake.  No symptoms of angina or decompensation.  He does note some dizziness at times, though suspect this is when he has gone without eating.  No significant lower extremity swelling.  Overall, he feels like he is doing well and does not have  any acute concerns at this time.   Labs independently reviewed: 10/2021 - potassium 4.1, BUN 15, serum creatinine 1.06 07/2021 - A1c 6.3, Hgb 14.8, PLT 286, TC 108, TG 140, HDL 34, LDL 46, albumin 4.3, AST/ALT normal   History reviewed. No pertinent past medical history.  Past Surgical History:  Procedure Laterality Date   TENDON REPAIR Left    hand-injury as a child   WISDOM TOOTH EXTRACTION Bilateral     Current Medications: Current Meds  Medication Sig   amLODipine (NORVASC) 10 MG tablet Take 1 tablet (10 mg total) by mouth daily.   losartan (COZAAR) 100 MG tablet Take 1 tablet (100 mg total) by mouth daily.   [DISCONTINUED] losartan (COZAAR) 50 MG tablet Take 1 tablet (50 mg total) by mouth daily.    Allergies:   Patient has no known allergies.   Social History   Socioeconomic History   Marital status: Married    Spouse name: [name]   Number of children: 0   Years of education: Not on file   Highest education level: Not on file  Occupational History   Occupation: Estate manager/land agent: [employer]  Tobacco Use   Smoking status: Every Day    Packs/day: 0.25    Years: 35.00    Total pack years: 8.75    Types: Cigarettes   Smokeless tobacco: Never  Vaping Use   Vaping Use: Never used  Substance and  Sexual Activity   Alcohol use: Yes    Comment: occasion   Drug use: Never   Sexual activity: Yes    Partners: Female    Birth control/protection: None  Other Topics Concern   Not on file  Social History Narrative   Not on file   Social Determinants of Health   Financial Resource Strain: Not on file  Food Insecurity: Not on file  Transportation Needs: Not on file  Physical Activity: Not on file  Stress: Not on file  Social Connections: Not on file     Family History:  The patient's family history includes Healthy in his brother and mother; Heart attack (age of onset: 24) in his father; Heart disease in his paternal grandfather; Lung cancer in his father.  ROS:    12-point review of systems is negative unless otherwise noted in the HPI.   EKGs/Labs/Other Studies Reviewed:    Studies reviewed were summarized above. The additional studies were reviewed today: None available for review.  EKG:  EKG is ordered today.  The EKG ordered today demonstrates NSR, 73 bpm, incomplete RBBB  Recent Labs: 07/23/2021: ALT 19; Hemoglobin 14.8; Platelets 286.0 10/29/2021: BUN 15; Creatinine, Ser 1.06; Potassium 4.1; Sodium 141  Recent Lipid Panel    Component Value Date/Time   CHOL 108 07/23/2021 0857   TRIG 140.0 07/23/2021 0857   HDL 34.00 (L) 07/23/2021 0857   CHOLHDL 3 07/23/2021 0857   VLDL 28.0 07/23/2021 0857   LDLCALC 46 07/23/2021 0857    PHYSICAL EXAM:    VS:  BP 130/88 (BP Location: Left Arm, Patient Position: Sitting, Cuff Size: Normal)   Pulse 73   Ht '5\' 8"'$  (1.727 m)   Wt 202 lb 6 oz (91.8 kg)   SpO2 97%   BMI 30.77 kg/m   BMI: Body mass index is 30.77 kg/m.  Physical Exam Vitals reviewed.  Constitutional:      Appearance: He is well-developed.  HENT:     Head: Normocephalic and atraumatic.  Eyes:     General:        Right eye: No discharge.        Left eye: No discharge.  Neck:     Vascular: No JVD.  Cardiovascular:     Rate and Rhythm: Normal rate and regular rhythm.     Heart sounds: Normal heart sounds, S1 normal and S2 normal. Heart sounds not distant. No midsystolic click and no opening snap. No murmur heard.    No friction rub.  Pulmonary:     Effort: Pulmonary effort is normal. No respiratory distress.     Breath sounds: Normal breath sounds. No decreased breath sounds, wheezing or rales.  Chest:     Chest wall: No tenderness.  Abdominal:     General: There is no distension.  Musculoskeletal:     Cervical back: Normal range of motion.  Skin:    General: Skin is warm and dry.     Nails: There is no clubbing.  Neurological:     Mental Status: He is alert and oriented to person, place, and time.  Psychiatric:         Speech: Speech normal.        Behavior: Behavior normal.        Thought Content: Thought content normal.        Judgment: Judgment normal.     Wt Readings from Last 3 Encounters:  12/31/21 202 lb 6 oz (91.8 kg)  10/29/21 203 lb 2 oz (92.1  kg)  08/27/21 203 lb (92.1 kg)     ASSESSMENT & PLAN:   HTN: Blood pressure is reasonably controlled in the office today.  Titrate losartan to 100 mg daily (he will slowly titrate beginning at 75 mg daily to use up his current supply of 50 and 25 mg tabs).  Continue amlodipine 10 mg daily.  Continue low-sodium diet.  Check labs at next visit.  RBBB: Stable.  No red flag symptoms.  Tobacco use: Complete cessation is encouraged.    Disposition: F/u with Dr. Garen Lah or an APP in 2 months.   Medication Adjustments/Labs and Tests Ordered: Current medicines are reviewed at length with the patient today.  Concerns regarding medicines are outlined above. Medication changes, Labs and Tests ordered today are summarized above and listed in the Patient Instructions accessible in Encounters.   Signed, Christell Faith, PA-C 12/31/2021 12:41 PM     [facility] [address] [place], [place] [address] [phone]

## 2021-12-31 ENCOUNTER — Encounter: Payer: Self-pay | Admitting: Physician Assistant

## 2021-12-31 ENCOUNTER — Ambulatory Visit: Payer: 59 | Attending: Physician Assistant | Admitting: Physician Assistant

## 2021-12-31 VITALS — BP 130/88 | HR 73 | Ht 68.0 in | Wt 202.4 lb

## 2021-12-31 DIAGNOSIS — I10 Essential (primary) hypertension: Secondary | ICD-10-CM | POA: Diagnosis not present

## 2021-12-31 DIAGNOSIS — I4510 Unspecified right bundle-branch block: Secondary | ICD-10-CM | POA: Diagnosis not present

## 2021-12-31 DIAGNOSIS — Z720 Tobacco use: Secondary | ICD-10-CM

## 2021-12-31 MED ORDER — LOSARTAN POTASSIUM 100 MG PO TABS: 100.0000 mg | ORAL_TABLET | Freq: Every day | ORAL | 3 refills | Status: DC

## 2021-12-31 NOTE — Patient Instructions (Signed)
Medication Instructions:  Your physician has recommended you make the following change in your medication:   INCREASE Losartan to 100 mg once daily.   *If you need a refill on your cardiac medications before your next appointment, please call your pharmacy*   Lab Work: None  If you have labs (blood work) drawn today and your tests are completely normal, you will receive your results only by: [place] (if you have MyChart) OR A paper copy in the mail If you have any lab test that is abnormal or we need to change your treatment, we will call you to review the results.   Testing/Procedures: None   Follow-Up: At [facility], you and your health needs are our priority.  As part of our continuing mission to provide you with exceptional heart care, we have created designated Provider Care Teams.  These Care Teams include your primary Cardiologist (physician) and Advanced Practice Providers (APPs -  Physician Assistants and Nurse Practitioners) who all work together to provide you with the care you need, when you need it.   Your next appointment:   2 month(s)  The format for your next appointment:   In Person  Provider:   Kate Sable, MD or Christell Faith, PA-C       Important Information About Sugar

## 2022-01-24 ENCOUNTER — Ambulatory Visit: Payer: 59 | Admitting: Dermatology

## 2022-03-01 ENCOUNTER — Other Ambulatory Visit: Payer: Self-pay | Admitting: Family

## 2022-03-01 DIAGNOSIS — I10 Essential (primary) hypertension: Secondary | ICD-10-CM

## 2022-03-07 NOTE — Progress Notes (Signed)
Cardiology Office Note    Date:  03/11/2022   ID:  Marcus Holmes, DOB 11-25-1970, MRN [id number]  PCP:  Eugenia Pancoast, FNP  Cardiologist:  Kate Sable, MD  Electrophysiologist:  None   Chief Complaint: Follow-up  History of Present Illness:   Marcus Holmes is a 51 y.o. male with history of HTN, ongoing tobacco use, and RBBB who presents for follow-up of HTN.   He was evaluated by Dr. Garen Lah as a new patient in 08/2021 at the request of his PCP for hypertension and right bundle branch block.  At that time, it was noted he had been evaluated preoperatively for an umbilical hernia repair with blood pressure noted to be elevated in the 200s.  He was started on amlodipine, which was subsequently titrated and losartan was added with noted improvement in BP readings ultimately to the 213Y to 865H systolic.  He had also been cutting back on his salt.  He did continue to smoke, though had been working on quitting.  He was without symptoms of angina or decompensation.   No prior cardiac imaging available for review.   He was seen in the office in 10/2021 and was without symptoms of angina or decompensation.  Blood pressures were typically in the 140s over 90s.  Losartan was titrated to 50 mg daily with continuation of amlodipine.  He was last seen in the office in 12/2021 and was without symptoms of angina or decompensation.  Home BP readings were largely in the 846N systolic with his brachial BP cuff.  Blood pressure in the office 130/88.  Losartan was titrated to 100 mg with continuation of amlodipine 10 mg.  He comes in doing well from a cardiac perspective and is without symptoms of angina or decompensation.  No dizziness, presyncope, or syncope.  No palpitations.  He has titrated his losartan from 75 mg to 100 mg daily within the past 3 to 4 days.  BP at home continues to run in the 629B systolic.  Currently only drinks 1 cup of coffee daily and is smoking  approximately 7 cigarettes/day.  He does note some leg cramping without claudication.   Labs independently reviewed: 10/2021 - potassium 4.1, BUN 15, serum creatinine 1.06 07/2021 - A1c 6.3, Hgb 14.8, PLT 286, TC 108, TG 140, HDL 34, LDL 46, albumin 4.3, AST/ALT normal  Past Medical History:  Diagnosis Date   Essential hypertension     Past Surgical History:  Procedure Laterality Date   TENDON REPAIR Left    hand-injury as a child   WISDOM TOOTH EXTRACTION Bilateral     Current Medications: Current Meds  Medication Sig   [DISCONTINUED] amLODipine (NORVASC) 10 MG tablet Take 1 tablet (10 mg total) by mouth daily.   [DISCONTINUED] losartan (COZAAR) 100 MG tablet Take 1 tablet (100 mg total) by mouth daily.    Allergies:   Patient has no known allergies.   Social History   Socioeconomic History   Marital status: Married    Spouse name: [name]   Number of children: 0   Years of education: Not on file   Highest education level: Not on file  Occupational History   Occupation: Estate manager/land agent: [employer]  Tobacco Use   Smoking status: Every Day    Packs/day: 0.25    Years: 35.00    Total pack years: 8.75    Types: Cigarettes   Smokeless tobacco: Never  Vaping Use   Vaping Use: Never used  Substance and  Sexual Activity   Alcohol use: Yes    Comment: occasion   Drug use: Never   Sexual activity: Yes    Partners: Female    Birth control/protection: None  Other Topics Concern   Not on file  Social History Narrative   Not on file   Social Determinants of Health   Financial Resource Strain: Not on file  Food Insecurity: Not on file  Transportation Needs: Not on file  Physical Activity: Not on file  Stress: Not on file  Social Connections: Not on file     Family History:  The patient's family history includes Healthy in his brother and mother; Heart attack (age of onset: 63) in his father; Heart disease in his paternal grandfather; Lung cancer in his  father.  ROS:   12-point review of systems is negative unless otherwise noted in the HPI.   EKGs/Labs/Other Studies Reviewed:    Studies reviewed were summarized above. The additional studies were reviewed today: None available for review.  EKG:  EKG is ordered today.  The EKG ordered today demonstrates NSR, 71 bpm, incomplete RBBB, no significant change when compared to prior  Recent Labs: 07/23/2021: ALT 19; Hemoglobin 14.8; Platelets 286.0 10/29/2021: BUN 15; Creatinine, Ser 1.06; Potassium 4.1; Sodium 141  Recent Lipid Panel    Component Value Date/Time   CHOL 108 07/23/2021 0857   TRIG 140.0 07/23/2021 0857   HDL 34.00 (L) 07/23/2021 0857   CHOLHDL 3 07/23/2021 0857   VLDL 28.0 07/23/2021 0857   LDLCALC 46 07/23/2021 0857    PHYSICAL EXAM:    VS:  BP (!) 142/84 (BP Location: Left Arm, Patient Position: Sitting, Cuff Size: Normal)   Pulse 71   Ht '5\' 9"'$  (1.753 m)   Wt 210 lb 3.2 oz (95.3 kg)   SpO2 95%   BMI 31.04 kg/m   BMI: Body mass index is 31.04 kg/m.  Physical Exam Vitals reviewed.  Constitutional:      Appearance: He is well-developed.  HENT:     Head: Normocephalic and atraumatic.  Eyes:     General:        Right eye: No discharge.        Left eye: No discharge.  Neck:     Vascular: No JVD.  Cardiovascular:     Rate and Rhythm: Normal rate and regular rhythm.     Heart sounds: Normal heart sounds, S1 normal and S2 normal. Heart sounds not distant. No midsystolic click and no opening snap. No murmur heard.    No friction rub.  Pulmonary:     Effort: Pulmonary effort is normal. No respiratory distress.     Breath sounds: Normal breath sounds. No decreased breath sounds, wheezing or rales.  Chest:     Chest wall: No tenderness.  Abdominal:     General: There is no distension.  Musculoskeletal:     Cervical back: Normal range of motion.  Skin:    General: Skin is warm and dry.     Nails: There is no clubbing.  Neurological:     Mental Status: He  is alert and oriented to person, place, and time.  Psychiatric:        Speech: Speech normal.        Behavior: Behavior normal.        Thought Content: Thought content normal.        Judgment: Judgment normal.     Wt Readings from Last 3 Encounters:  03/11/22 210 lb 3.2 oz (95.3  kg)  12/31/21 202 lb 6 oz (91.8 kg)  10/29/21 203 lb 2 oz (92.1 kg)     ASSESSMENT & PLAN:   HTN: Blood pressure blood pressure continues to run on the high side.  He just recently started losartan 100 mg daily approximately 3 to 4 days ago, which was a titration from 75 mg.  He remains on amlodipine 10 mg.  Continue with lifestyle modification and heart healthy diet.  He will send [place] a message in 2 to 3 weeks with how his blood pressure has trended.  If his blood pressure remains greater than 140s over 90s at that time, would look to start carvedilol 6.25 mg twice daily as well as follow-up labs and pursue echo.  RBBB: Stable.  Consider echo in follow-up.  Tobacco use: Continues to smoke approximately 7 cigarettes/day.  Complete cessation is encouraged.   Disposition: F/u with Dr. Garen Lah or an APP in 2 to 3 months.   Medication Adjustments/Labs and Tests Ordered: Current medicines are reviewed at length with the patient today.  Concerns regarding medicines are outlined above. Medication changes, Labs and Tests ordered today are summarized above and listed in the Patient Instructions accessible in Encounters.   Signed, Christell Faith, PA-C 03/11/2022 2:11 PM     [facility] [address] [place], [place] [address] [phone]

## 2022-03-11 ENCOUNTER — Ambulatory Visit: Payer: 59 | Attending: Physician Assistant | Admitting: Physician Assistant

## 2022-03-11 ENCOUNTER — Encounter: Payer: Self-pay | Admitting: Physician Assistant

## 2022-03-11 VITALS — BP 142/84 | HR 71 | Ht 69.0 in | Wt 210.2 lb

## 2022-03-11 DIAGNOSIS — I10 Essential (primary) hypertension: Secondary | ICD-10-CM

## 2022-03-11 DIAGNOSIS — Z720 Tobacco use: Secondary | ICD-10-CM | POA: Diagnosis not present

## 2022-03-11 DIAGNOSIS — I4510 Unspecified right bundle-branch block: Secondary | ICD-10-CM | POA: Diagnosis not present

## 2022-03-11 MED ORDER — AMLODIPINE BESYLATE 10 MG PO TABS: 10.0000 mg | ORAL_TABLET | Freq: Every day | ORAL | 3 refills | Status: DC

## 2022-03-11 MED ORDER — LOSARTAN POTASSIUM 100 MG PO TABS: 100.0000 mg | ORAL_TABLET | Freq: Every day | ORAL | 3 refills | Status: DC

## 2022-03-11 NOTE — Patient Instructions (Signed)
Medication Instructions:  No changes at this time.   *If you need a refill on your cardiac medications before your next appointment, please call your pharmacy*   Lab Work: None  If you have labs (blood work) drawn today and your tests are completely normal, you will receive your results only by: [place] (if you have MyChart) OR A paper copy in the mail If you have any lab test that is abnormal or we need to change your treatment, we will call you to review the results.   Testing/Procedures: None   Follow-Up: At [facility], you and your health needs are our priority.  As part of our continuing mission to provide you with exceptional heart care, we have created designated Provider Care Teams.  These Care Teams include your primary Cardiologist (physician) and Advanced Practice Providers (APPs -  Physician Assistants and Nurse Practitioners) who all work together to provide you with the care you need, when you need it.   Your next appointment:   3 month(s)  The format for your next appointment:   In Person  Provider:   Kate Sable, MD or Christell Faith, PA-C         Important Information About Sugar

## 2022-06-01 ENCOUNTER — Encounter: Payer: Self-pay | Admitting: Family

## 2022-06-03 ENCOUNTER — Other Ambulatory Visit: Payer: Self-pay

## 2022-06-03 DIAGNOSIS — I10 Essential (primary) hypertension: Secondary | ICD-10-CM

## 2022-06-04 NOTE — Telephone Encounter (Signed)
Patient called back and said it needs to go to [place], [place] . He said he's out of Losartan and about a week on the amlodipine

## 2022-06-05 NOTE — Telephone Encounter (Signed)
Called patient reviewed all information. He will reach out to cardiology for refill. He was not able to set up appointment at this time did not have calendar with him will reach out to office at later time.

## 2022-06-05 NOTE — Telephone Encounter (Signed)
It looks as though cardiology has been filling these two medications.  Marcus Holmes which is the PA at cardiology office filled these last 03/11/22 with 90 day supply. Pt is due for f/u with cardiology. Has to reach out to pharmacy to request refill and they will inform cardiology refill. He can also call the cardiology office for refills.  On my end, he is overdue for f/u appointment was due back in May 2023. Please have him schedule a f/u appt.

## 2022-06-07 ENCOUNTER — Telehealth: Payer: Self-pay | Admitting: Cardiology

## 2022-06-07 DIAGNOSIS — I10 Essential (primary) hypertension: Secondary | ICD-10-CM

## 2022-06-07 MED ORDER — LOSARTAN POTASSIUM 100 MG PO TABS: 100.0000 mg | ORAL_TABLET | Freq: Every day | ORAL | 8 refills | Status: DC

## 2022-06-07 MED ORDER — AMLODIPINE BESYLATE 10 MG PO TABS: 10.0000 mg | ORAL_TABLET | Freq: Every day | ORAL | 8 refills | Status: DC

## 2022-06-07 NOTE — Telephone Encounter (Signed)
*  STAT* If patient is at the pharmacy, call can be transferred to refill team.   1. Which medications need to be refilled? (please list name of each medication and dose if known)  amLODipine (NORVASC) 10 MG tablet losartan (COZAAR) 100 MG tablet  2. Which pharmacy/location (including street and city if local pharmacy) is medication to be sent to?[id number] - [place], [place] [address]  3. Do they need a 30 day or 90 day supply? 30 Day for both medications

## 2022-06-30 NOTE — Progress Notes (Unsigned)
Cardiology Office Note:    Date:  07/01/2022   ID:  Marcus Holmes, DOB 1970-09-05, MRN [id number]  PCP:  Eugenia Pancoast, [place] [facility] Cardiologist:  Kate Sable, MD     Referring MD: Eugenia Pancoast, FNP   CC: follow up of HTN  History of Present Illness:    Marcus Holmes is a 52 y.o. male with a hx of hypertension, incomplete RBBB.  Evaluated by heart care in May 2023 at the behest of his PCP for hypertension and RBBB.  He had recently been evaluated preoperatively for an umbilical hernia repair and blood pressure was noted to be in the 123456 systolic.  He was started on amlodipine, then losartan, and blood pressure appeared to be controlled.   Most recently evaluated by Christell Faith PA on 03/11/22, his blood pressure was elevated at this visit however his losartan had recently been adjusted about 4 days prior to this visit.  He was to keep a blood pressure log and report his readings back to the office.  He presents today for follow-up of his hypertension.  He reports that his blood pressure is much better than it has been however states that most of his blood pressure readings are in the high [id number] systolic range at home.  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  He takes to 15-minute walks twice a day.  He has decreased his tobacco intake although continues to smoke roughly 3 packs/week.  He is being cognizant of the sodium content in his food. He denies chest pain, palpitations, dyspnea, pnd, orthopnea, n, v, dizziness, syncope, edema, weight gain, or early satiety.   Past Medical History:  Diagnosis Date   Essential hypertension     Past Surgical History:  Procedure Laterality Date   TENDON REPAIR Left    hand-injury as a child   WISDOM TOOTH EXTRACTION Bilateral     Current Medications: Current Meds  Medication Sig   amLODipine (NORVASC) 10 MG tablet Take 1 tablet  (10 mg total) by mouth daily.   carvedilol (COREG) 6.25 MG tablet Take 1 tablet (6.25 mg total) by mouth 2 (two) times daily.   losartan (COZAAR) 100 MG tablet Take 1 tablet (100 mg total) by mouth daily.     Allergies:   Patient has no known allergies.   Social History   Socioeconomic History   Marital status: Married    Spouse name: [name]   Number of children: 0   Years of education: Not on file   Highest education level: Not on file  Occupational History   Occupation: Estate manager/land agent: [employer]  Tobacco Use   Smoking status: Every Day    Packs/day: 0.25    Years: 35.00    Additional pack years: 0.00    Total pack years: 8.75    Types: Cigarettes   Smokeless tobacco: Never  Vaping Use   Vaping Use: Never used  Substance and Sexual Activity   Alcohol use: Yes    Comment: occasion   Drug use: Never   Sexual activity: Yes    Partners: Female    Birth control/protection: None  Other Topics Concern   Not on file  Social History Narrative   Not on file   Social Determinants of Health   Financial Resource Strain: Not on file  Food Insecurity: Not on file  Transportation Needs: Not on file  Physical Activity: Not on file  Stress: Not on file  Social Connections: Not on file     Family History: The patient's family history includes Healthy in his brother and mother; Heart attack (age of onset: 39) in his father; Heart disease in his paternal grandfather; Lung cancer in his father.  ROS:   Please see the history of present illness.    All other systems reviewed and are negative.  EKGs/Labs/Other Studies Reviewed:    The following studies were reviewed today:  EKG:  EKG is not ordered today.   Recent Labs: 07/23/2021: ALT 19; Hemoglobin 14.8; Platelets 286.0 10/29/2021: BUN 15; Creatinine, Ser 1.06; Potassium 4.1; Sodium 141  Recent Lipid Panel    Component Value Date/Time   CHOL 108 07/23/2021 0857   TRIG 140.0 07/23/2021 0857   HDL 34.00 (L) 07/23/2021  0857   CHOLHDL 3 07/23/2021 0857   VLDL 28.0 07/23/2021 0857   LDLCALC 46 07/23/2021 0857     Risk Assessment/Calculations:      HYPERTENSION CONTROL Vitals:   07/01/22 1325 07/01/22 1427  BP: (!) 146/90 (!) 142/72    The patient's blood pressure is elevated above target today.  In order to address the patient's elevated BP: A current anti-hypertensive medication was adjusted today.            Physical Exam:    VS:  BP (!) 142/72   Pulse 77   Ht 5\' 9"  (1.753 m)   Wt 206 lb 3.2 oz (93.5 kg)   SpO2 98%   BMI 30.45 kg/m     Wt Readings from Last 3 Encounters:  07/01/22 206 lb 3.2 oz (93.5 kg)  03/11/22 210 lb 3.2 oz (95.3 kg)  12/31/21 202 lb 6 oz (91.8 kg)     GEN:  Well nourished, well developed in no acute distress HEENT: Normal NECK: No JVD; No carotid bruits LYMPHATICS: No lymphadenopathy CARDIAC: RRR, no murmurs, rubs, gallops RESPIRATORY:  Clear to auscultation without rales, wheezing or rhonchi  ABDOMEN: Soft, non-tender, non-distended MUSCULOSKELETAL:  No edema; No deformity  SKIN: Warm and dry NEUROLOGIC:  Alert and oriented x 3 PSYCHIATRIC:  Normal affect   ASSESSMENT:    1. Essential hypertension   2. RBBB   3. Tobacco abuse    PLAN:    In order of problems listed above:  HTN - BP today 146/90 > 142/72, start Coreg 6.25 mg twice daily. Maintain BP log and report to office if BP readings are sustained > 130's.  RBBB - noted on EKG. Will arrange for echo complete since his BP was unmanaged for some time, to check for LVH.   Tobacco abuse - he is cutting down, however still smoking 3 packs/week, encouraged total cessation.    Disposition - start carvedilol 6.25 mg twice daily, echo complete. Follow up in 3 months.        Medication Adjustments/Labs and Tests Ordered: Current medicines are reviewed at length with the patient today.  Concerns regarding medicines are outlined above.  Orders Placed This Encounter  Procedures    ECHOCARDIOGRAM COMPLETE   Meds ordered this encounter  Medications   carvedilol (COREG) 6.25 MG tablet    Sig: Take 1 tablet (6.25 mg total) by mouth 2 (two) times daily.    Dispense:  180 tablet    Refill:  0    Patient Instructions  Medication Instructions:  START carvedilol 6.25 mg by mouth twice a day  *If you need a refill on your cardiac medications  before your next appointment, please call your pharmacy*  Lab Work: No labs ordered  If you have labs (blood work) drawn today and your tests are completely normal, you will receive your results only by: [place] (if you have MyChart) OR A paper copy in the mail If you have any lab test that is abnormal or we need to change your treatment, we will call you to review the results.  Testing/Procedures: Your physician has requested that you have an echocardiogram. Echocardiography is a painless test that uses sound waves to create images of your heart. It provides your doctor with information about the size and shape of your heart and how well your heart's chambers and valves are working. This procedure takes approximately one hour. There are no restrictions for this procedure. Please do NOT wear cologne, perfume, aftershave, or lotions (deodorant is allowed). Please arrive 15 minutes prior to your appointment time.  Follow-Up: At [facility], you and your health needs are our priority.  As part of our continuing mission to provide you with exceptional heart care, we have created designated Provider Care Teams.  These Care Teams include your primary Cardiologist (physician) and Advanced Practice Providers (APPs -  Physician Assistants and Nurse Practitioners) who all work together to provide you with the care you need, when you need it.  We recommend signing up for the patient portal called "MyChart".  Sign up information is provided on this After Visit Summary.  MyChart is used to connect with patients for Virtual  Visits (Telemedicine).  Patients are able to view lab/test results, encounter notes, upcoming appointments, etc.  Non-urgent messages can be sent to your provider as well.   To learn more about what you can do with MyChart, go to [email or web address].    Your next appointment:   3 month(s)  Provider:   Kate Sable, MD    Signed, Trudi Ida, NP  07/01/2022 2:30 PM    [place]

## 2022-07-01 ENCOUNTER — Encounter: Payer: Self-pay | Admitting: Physician Assistant

## 2022-07-01 ENCOUNTER — Ambulatory Visit: Payer: BC Managed Care – PPO | Attending: Physician Assistant | Admitting: Cardiology

## 2022-07-01 VITALS — BP 142/72 | HR 77 | Ht 69.0 in | Wt 206.2 lb

## 2022-07-01 DIAGNOSIS — I10 Essential (primary) hypertension: Secondary | ICD-10-CM | POA: Diagnosis not present

## 2022-07-01 DIAGNOSIS — Z720 Tobacco use: Secondary | ICD-10-CM

## 2022-07-01 DIAGNOSIS — I4510 Unspecified right bundle-branch block: Secondary | ICD-10-CM

## 2022-07-01 MED ORDER — CARVEDILOL 6.25 MG PO TABS: 6.2500 mg | ORAL_TABLET | Freq: Two times a day (BID) | ORAL | 0 refills | Status: DC

## 2022-07-01 NOTE — Patient Instructions (Addendum)
Medication Instructions:  START carvedilol 6.25 mg by mouth twice a day  *If you need a refill on your cardiac medications before your next appointment, please call your pharmacy*  Lab Work: No labs ordered  If you have labs (blood work) drawn today and your tests are completely normal, you will receive your results only by: [place] (if you have MyChart) OR A paper copy in the mail If you have any lab test that is abnormal or we need to change your treatment, we will call you to review the results.  Testing/Procedures: Your physician has requested that you have an echocardiogram. Echocardiography is a painless test that uses sound waves to create images of your heart. It provides your doctor with information about the size and shape of your heart and how well your heart's chambers and valves are working. This procedure takes approximately one hour. There are no restrictions for this procedure. Please do NOT wear cologne, perfume, aftershave, or lotions (deodorant is allowed). Please arrive 15 minutes prior to your appointment time.  Follow-Up: At [facility], you and your health needs are our priority.  As part of our continuing mission to provide you with exceptional heart care, we have created designated Provider Care Teams.  These Care Teams include your primary Cardiologist (physician) and Advanced Practice Providers (APPs -  Physician Assistants and Nurse Practitioners) who all work together to provide you with the care you need, when you need it.  We recommend signing up for the patient portal called "MyChart".  Sign up information is provided on this After Visit Summary.  MyChart is used to connect with patients for Virtual Visits (Telemedicine).  Patients are able to view lab/test results, encounter notes, upcoming appointments, etc.  Non-urgent messages can be sent to your provider as well.   To learn more about what you can do with MyChart, go to  [email or web address].    Your next appointment:   3 month(s)  Provider:   Kate Sable, MD

## 2022-07-10 ENCOUNTER — Encounter: Payer: Self-pay | Admitting: Family

## 2022-07-23 ENCOUNTER — Ambulatory Visit: Payer: BC Managed Care – PPO | Attending: Cardiology

## 2022-07-23 DIAGNOSIS — I4510 Unspecified right bundle-branch block: Secondary | ICD-10-CM

## 2022-07-24 LAB — ECHOCARDIOGRAM COMPLETE
AR max vel: 4.21 cm2
AV Area VTI: 4.5 cm2
AV Area mean vel: 4.03 cm2
AV Mean grad: 4 mmHg
AV Peak grad: 6.9 mmHg
Ao pk vel: 1.31 m/s
Area-P 1/2: 2.52 cm2
Calc EF: 56.4 %
S' Lateral: 3.3 cm
Single Plane A2C EF: 55.3 %
Single Plane A4C EF: 57.7 %

## 2022-10-01 ENCOUNTER — Ambulatory Visit: Payer: BC Managed Care – PPO | Attending: Cardiology | Admitting: Cardiology

## 2022-10-01 ENCOUNTER — Encounter: Payer: Self-pay | Admitting: Cardiology

## 2022-10-01 VITALS — BP 130/80 | HR 64 | Ht 68.0 in | Wt 201.6 lb

## 2022-10-01 DIAGNOSIS — F17200 Nicotine dependence, unspecified, uncomplicated: Secondary | ICD-10-CM | POA: Diagnosis not present

## 2022-10-01 DIAGNOSIS — I10 Essential (primary) hypertension: Secondary | ICD-10-CM | POA: Diagnosis not present

## 2022-10-01 NOTE — Patient Instructions (Signed)
Medication Instructions:   1 . Your physician recommends that you continue on your current medications as directed. Please refer to the Current Medication list given to you today. *If you need a refill on your cardiac medications before your next appointment, please call your pharmacy*   Lab Work:  None Ordered  If you have labs (blood work) drawn today and your tests are completely normal, you will receive your results only by: MyChart Message (if you have MyChart) OR A paper copy in the mail If you have any lab test that is abnormal or we need to change your treatment, we will call you to review the results.   Testing/Procedures:  None Ordered  Follow-Up: At [facility], you and your health needs are our priority.  As part of our continuing mission to provide you with exceptional heart care, we have created designated Provider Care Teams.  These Care Teams include your primary Cardiologist (physician) and Advanced Practice Providers (APPs -  Physician Assistants and Nurse Practitioners) who all work together to provide you with the care you need, when you need it.  We recommend signing up for the patient portal called "MyChart".  Sign up information is provided on this After Visit Summary.  MyChart is used to connect with patients for Virtual Visits (Telemedicine).  Patients are able to view lab/test results, encounter notes, upcoming appointments, etc.  Non-urgent messages can be sent to your provider as well.   To learn more about what you can do with MyChart, go to [email or web address].    Your next appointment:   6 month(s)  Provider:   You may see Debbe Odea, MD or one of the following Advanced Practice Providers on your designated Care Team:   Nicolasa Ducking, NP Eula Listen, PA-C Cadence Fransico Michael, PA-C Charlsie Quest, NP

## 2022-10-01 NOTE — Progress Notes (Signed)
Cardiology Office Note:    Date:  10/01/2022   ID:  Marcus Holmes, DOB 04-Jan-1971, MRN [id number]  PCP:  Mort Sawyers, FNP   [facility] Providers Cardiologist:  Debbe Odea, MD     Referring MD: Mort Sawyers, FNP   Chief Complaint  Patient presents with   Follow-up    3 month follow up - Patient has no complaints at this time.  Meds reviewed verbally with patient.     History of Present Illness:    Marcus Holmes is a 52 y.o. male with a hx of hypertension, current smoker x25+ years who presents for follow-up.  Last seen with elevated BPs, medications titrated.  Working on eating healthier and exercising more.  He still smokes.  Blood pressures at home have improved with systolics in the 130s.  Overall doing okay, no concerns at this time.  Past Medical History:  Diagnosis Date   Essential hypertension     Past Surgical History:  Procedure Laterality Date   TENDON REPAIR Left    hand-injury as a child   WISDOM TOOTH EXTRACTION Bilateral     Current Medications: Current Meds  Medication Sig   amLODipine (NORVASC) 10 MG tablet Take 1 tablet (10 mg total) by mouth daily.   carvedilol (COREG) 6.25 MG tablet Take 1 tablet (6.25 mg total) by mouth 2 (two) times daily.   losartan (COZAAR) 100 MG tablet Take 1 tablet (100 mg total) by mouth daily.     Allergies:   Patient has no known allergies.   Social History   Socioeconomic History   Marital status: Married    Spouse name: Victorino Dike   Number of children: 0   Years of education: Not on file   Highest education level: Not on file  Occupational History   Occupation: Personal assistant: [employer]  Tobacco Use   Smoking status: Every Day    Packs/day: 0.25    Years: 35.00    Additional pack years: 0.00    Total pack years: 8.75    Types: Cigarettes   Smokeless tobacco: Never  Vaping Use   Vaping Use: Never used  Substance and Sexual Activity   Alcohol use: Yes     Comment: occasion   Drug use: Never   Sexual activity: Yes    Partners: Female    Birth control/protection: None  Other Topics Concern   Not on file  Social History Narrative   Not on file   Social Determinants of Health   Financial Resource Strain: Not on file  Food Insecurity: Not on file  Transportation Needs: Not on file  Physical Activity: Not on file  Stress: Not on file  Social Connections: Not on file     Family History: The patient's family history includes Healthy in his brother and mother; Heart attack (age of onset: 88) in his father; Heart disease in his paternal grandfather; Lung cancer in his father.  ROS:   Please see the history of present illness.     All other systems reviewed and are negative.  EKGs/Labs/Other Studies Reviewed:    The following studies were reviewed today:   EKG:  EKG not ordered today.  Recent Labs: 10/29/2021: BUN 15; Creatinine, Ser 1.06; Potassium 4.1; Sodium 141  Recent Lipid Panel    Component Value Date/Time   CHOL 108 07/23/2021 0857   TRIG 140.0 07/23/2021 0857   HDL 34.00 (L) 07/23/2021 0857   CHOLHDL 3 07/23/2021 0857   VLDL 28.0 07/23/2021  0857   LDLCALC 46 07/23/2021 0857     Risk Assessment/Calculations:          Physical Exam:    VS:  BP 130/80 (BP Location: Left Arm, Patient Position: Sitting, Cuff Size: Normal)   Pulse 64   Ht 5\' 8"  (1.727 m)   Wt 201 lb 9.6 oz (91.4 kg)   SpO2 97%   BMI 30.65 kg/m     Wt Readings from Last 3 Encounters:  10/01/22 201 lb 9.6 oz (91.4 kg)  07/01/22 206 lb 3.2 oz (93.5 kg)  03/11/22 210 lb 3.2 oz (95.3 kg)     GEN:  Well nourished, well developed in no acute distress HEENT: Normal NECK: No JVD; No carotid bruits CARDIAC: RRR, no murmurs, rubs, gallops RESPIRATORY:  Clear to auscultation without rales, wheezing or rhonchi  ABDOMEN: Soft, non-tender, non-distended MUSCULOSKELETAL:  No edema; No deformity  SKIN: Warm and dry NEUROLOGIC:  Alert and oriented x  3 PSYCHIATRIC:  Normal affect   ASSESSMENT:    1. Primary hypertension   2. Smoking     PLAN:    In order of problems listed above:  Hypertension, BP controlled, continue losartan 100 mg daily, Norvasc 10 mg daily, Coreg 6.25 mg twice daily.  Low-salt diet and increased activity strongly advised. Current smoker, cessation advised.  Follow-up in 6 months.  If BP stays controlled in 6 months, okay to follow-up as needed.      Medication Adjustments/Labs and Tests Ordered: Current medicines are reviewed at length with the patient today.  Concerns regarding medicines are outlined above.  No orders of the defined types were placed in this encounter.  No orders of the defined types were placed in this encounter.   Patient Instructions  Medication Instructions:   1 . Your physician recommends that you continue on your current medications as directed. Please refer to the Current Medication list given to you today. *If you need a refill on your cardiac medications before your next appointment, please call your pharmacy*   Lab Work:  None Ordered  If you have labs (blood work) drawn today and your tests are completely normal, you will receive your results only by: MyChart Message (if you have MyChart) OR A paper copy in the mail If you have any lab test that is abnormal or we need to change your treatment, we will call you to review the results.   Testing/Procedures:  None Ordered  Follow-Up: At [facility], you and your health needs are our priority.  As part of our continuing mission to provide you with exceptional heart care, we have created designated Provider Care Teams.  These Care Teams include your primary Cardiologist (physician) and Advanced Practice Providers (APPs -  Physician Assistants and Nurse Practitioners) who all work together to provide you with the care you need, when you need it.  We recommend signing up for the patient portal called "MyChart".   Sign up information is provided on this After Visit Summary.  MyChart is used to connect with patients for Virtual Visits (Telemedicine).  Patients are able to view lab/test results, encounter notes, upcoming appointments, etc.  Non-urgent messages can be sent to your provider as well.   To learn more about what you can do with MyChart, go to [email or web address].    Your next appointment:   6 month(s)  Provider:   You may see Debbe Odea, MD or one of the following Advanced Practice Providers on your designated Care Team:  Nicolasa Ducking, NP Eula Listen, PA-C Cadence Fransico Michael, PA-C Charlsie Quest, NP    Signed, Debbe Odea, MD  10/01/2022 9:41 AM    [facility]

## 2022-10-08 ENCOUNTER — Other Ambulatory Visit: Payer: Self-pay | Admitting: Physician Assistant

## 2023-03-18 ENCOUNTER — Other Ambulatory Visit: Payer: Self-pay | Admitting: Physician Assistant

## 2023-03-18 DIAGNOSIS — I10 Essential (primary) hypertension: Secondary | ICD-10-CM

## 2023-04-08 ENCOUNTER — Ambulatory Visit: Payer: BC Managed Care – PPO | Admitting: Cardiology

## 2023-04-14 ENCOUNTER — Other Ambulatory Visit: Payer: Self-pay

## 2023-04-14 MED ORDER — CARVEDILOL 6.25 MG PO TABS
6.2500 mg | ORAL_TABLET | Freq: Two times a day (BID) | ORAL | 0 refills | Status: DC
Start: 2023-04-14 — End: 2023-07-16

## 2023-04-14 NOTE — Telephone Encounter (Signed)
 Requested Prescriptions   Signed Prescriptions Disp Refills   carvedilol  (COREG ) 6.25 MG tablet 180 tablet 0    Sig: Take 1 tablet (6.25 mg total) by mouth 2 (two) times daily with a meal.    Authorizing Provider: DARLISS ROGUE    Ordering User: CLAUDENE POWELL CROME   Last office visit: 10/01/22 with plan to f/u in 6 months. next office visit: 05/26/23

## 2023-04-18 ENCOUNTER — Other Ambulatory Visit: Payer: Self-pay | Admitting: Physician Assistant

## 2023-04-18 DIAGNOSIS — I10 Essential (primary) hypertension: Secondary | ICD-10-CM

## 2023-05-17 ENCOUNTER — Other Ambulatory Visit: Payer: Self-pay | Admitting: Physician Assistant

## 2023-05-17 DIAGNOSIS — I10 Essential (primary) hypertension: Secondary | ICD-10-CM

## 2023-05-26 ENCOUNTER — Encounter: Payer: Self-pay | Admitting: Cardiology

## 2023-05-26 ENCOUNTER — Ambulatory Visit: Payer: BC Managed Care – PPO | Attending: Cardiology | Admitting: Cardiology

## 2023-05-26 VITALS — BP 142/72 | HR 63 | Ht 68.0 in | Wt 204.8 lb

## 2023-05-26 DIAGNOSIS — I10 Essential (primary) hypertension: Secondary | ICD-10-CM | POA: Diagnosis not present

## 2023-05-26 DIAGNOSIS — F17200 Nicotine dependence, unspecified, uncomplicated: Secondary | ICD-10-CM | POA: Diagnosis not present

## 2023-05-26 DIAGNOSIS — M25512 Pain in left shoulder: Secondary | ICD-10-CM

## 2023-05-26 NOTE — Patient Instructions (Signed)
 Medication Instructions:   Your physician recommends that you continue on your current medications as directed. Please refer to the Current Medication list given to you today.  *If you need a refill on your cardiac medications before your next appointment, please call your pharmacy*   Lab Work:  None Ordered  If you have labs (blood work) drawn today and your tests are completely normal, you will receive your results only by: MyChart Message (if you have MyChart) OR A paper copy in the mail If you have any lab test that is abnormal or we need to change your treatment, we will call you to review the results.   Testing/Procedures:  None Ordered   Follow-Up: At [facility], you and your health needs are our priority.  As part of our continuing mission to provide you with exceptional heart care, we have created designated Provider Care Teams.  These Care Teams include your primary Cardiologist (physician) and Advanced Practice Providers (APPs -  Physician Assistants and Nurse Practitioners) who all work together to provide you with the care you need, when you need it.  We recommend signing up for the patient portal called "MyChart".  Sign up information is provided on this After Visit Summary.  MyChart is used to connect with patients for Virtual Visits (Telemedicine).  Patients are able to view lab/test results, encounter notes, upcoming appointments, etc.  Non-urgent messages can be sent to your provider as well.   To learn more about what you can do with MyChart, go to [email or web address].    Your next appointment:   4 month(s)  Provider:   You may see Debbe Odea, MD or one of the following Advanced Practice Providers on your designated Care Team:   Nicolasa Ducking, NP Eula Listen, PA-C Cadence Fransico Michael, PA-C Charlsie Quest, NP Carlos Levering, NP

## 2023-05-26 NOTE — Progress Notes (Signed)
 Cardiology Office Note:    Date:  05/26/2023   ID:  Marcus Holmes, DOB [birth year], MRN [id number]  PCP:  Mort Sawyers, FNP   [facility] Providers Cardiologist:  Debbe Odea, MD     Referring MD: Mort Sawyers, FNP   Chief Complaint  Patient presents with   Follow-up    Patient reports right shoulder pain that feels like "nerve pain"  Not sure if this could be related to medications.      History of Present Illness:    Marcus Holmes is a 53 y.o. male with a hx of hypertension, current smoker x25+ years who presents for follow-up.  Compliant with blood pressure medications as prescribed.  Systolics at home in the high 130s, diastolic typically 90.  States increasing his smoking rate.  Has a left shoulder pain when he raises his arm above shoulder level.  Otherwise doing okay, denies chest pain or breathing issues.   Past Medical History:  Diagnosis Date   Essential hypertension     Past Surgical History:  Procedure Laterality Date   TENDON REPAIR Left    hand-injury as a child   WISDOM TOOTH EXTRACTION Bilateral     Current Medications: Current Meds  Medication Sig   amLODipine (NORVASC) 10 MG tablet TAKE 1 TABLET(10 MG) BY MOUTH DAILY   carvedilol (COREG) 6.25 MG tablet Take 1 tablet (6.25 mg total) by mouth 2 (two) times daily with a meal.   losartan (COZAAR) 100 MG tablet TAKE 1 TABLET(100 MG) BY MOUTH DAILY     Allergies:   Patient has no known allergies.   Social History   Socioeconomic History   Marital status: Married    Spouse name: Victorino Dike   Number of children: 0   Years of education: Not on file   Highest education level: Not on file  Occupational History   Occupation: Personal assistant: [employer]  Tobacco Use   Smoking status: Every Day    Current packs/day: 0.25    Average packs/day: 0.3 packs/day for 35.0 years (8.8 ttl pk-yrs)    Types: Cigarettes   Smokeless tobacco: Never  Vaping Use   Vaping  status: Never Used  Substance and Sexual Activity   Alcohol use: Yes    Comment: occasion   Drug use: Never   Sexual activity: Yes    Partners: Female    Birth control/protection: None  Other Topics Concern   Not on file  Social History Narrative   Not on file   Social Drivers of Health   Financial Resource Strain: Not on file  Food Insecurity: Not on file  Transportation Needs: Not on file  Physical Activity: Not on file  Stress: Not on file  Social Connections: Not on file     Family History: The patient's family history includes Healthy in his brother and mother; Heart attack (age of onset: 74) in his father; Heart disease in his paternal grandfather; Lung cancer in his father.  ROS:   Please see the history of present illness.     All other systems reviewed and are negative.  EKGs/Labs/Other Studies Reviewed:    The following studies were reviewed today:  EKG Interpretation Date/Time:  Monday May 26 2023 08:32:07 EST Ventricular Rate:  63 PR Interval:  178 QRS Duration:  110 QT Interval:  408 QTC Calculation: 417 R Axis:   30  Text Interpretation: Normal sinus rhythm Incomplete right bundle branch block Confirmed by Debbe Odea ([id number]) on 05/26/2023 8:39:36 AM  Recent Labs: No results found for requested labs within last 365 days.  Recent Lipid Panel    Component Value Date/Time   CHOL 108 07/23/2021 0857   TRIG 140.0 07/23/2021 0857   HDL 34.00 (L) 07/23/2021 0857   CHOLHDL 3 07/23/2021 0857   VLDL 28.0 07/23/2021 0857   LDLCALC 46 07/23/2021 0857     Risk Assessment/Calculations:          Physical Exam:    VS:  BP (!) 142/72 (BP Location: Left Arm, Patient Position: Sitting, Cuff Size: Large)   Pulse 63   Ht 5\' 8"  (1.727 m)   Wt 204 lb 12.8 oz (92.9 kg)   SpO2 97%   BMI 31.14 kg/m     Wt Readings from Last 3 Encounters:  05/26/23 204 lb 12.8 oz (92.9 kg)  10/01/22 201 lb 9.6 oz (91.4 kg)  07/01/22 206 lb 3.2 oz (93.5 kg)      GEN:  Well nourished, well developed in no acute distress HEENT: Normal NECK: No JVD; No carotid bruits CARDIAC: RRR, no murmurs, rubs, gallops RESPIRATORY:  Clear to auscultation without rales, wheezing or rhonchi  ABDOMEN: Soft, non-tender, non-distended MUSCULOSKELETAL:  No edema; No deformity  SKIN: Warm and dry NEUROLOGIC:  Alert and oriented x 3 PSYCHIATRIC:  Normal affect   ASSESSMENT:    1. Primary hypertension   2. Smoking   3. Acute pain of left shoulder    PLAN:    In order of problems listed above:  Hypertension, BP elevated.  Usually better controlled.  Current smoking a strong contributor.  Continue losartan 100 mg daily, Norvasc 10 mg daily, Coreg 6.25 mg twice daily.  Smoking cessation strongly advised.  If BP not controlled at follow-up visit, plan to titrate Coreg or add HCTZ. Current smoker, cessation advised. Left shoulder pain, musculoskeletal etiology, possible arthritis or pinched nerve.  Recommend follow-up with primary care physician.  Follow-up in 4 months.      Medication Adjustments/Labs and Tests Ordered: Current medicines are reviewed at length with the patient today.  Concerns regarding medicines are outlined above.  Orders Placed This Encounter  Procedures   EKG 12-Lead   No orders of the defined types were placed in this encounter.   Patient Instructions  Medication Instructions:   Your physician recommends that you continue on your current medications as directed. Please refer to the Current Medication list given to you today.  *If you need a refill on your cardiac medications before your next appointment, please call your pharmacy*   Lab Work:  None Ordered  If you have labs (blood work) drawn today and your tests are completely normal, you will receive your results only by: MyChart Message (if you have MyChart) OR A paper copy in the mail If you have any lab test that is abnormal or we need to change your treatment, we  will call you to review the results.   Testing/Procedures:  None Ordered   Follow-Up: At [facility], you and your health needs are our priority.  As part of our continuing mission to provide you with exceptional heart care, we have created designated Provider Care Teams.  These Care Teams include your primary Cardiologist (physician) and Advanced Practice Providers (APPs -  Physician Assistants and Nurse Practitioners) who all work together to provide you with the care you need, when you need it.  We recommend signing up for the patient portal called "MyChart".  Sign up information is provided on this After Visit  Summary.  MyChart is used to connect with patients for Virtual Visits (Telemedicine).  Patients are able to view lab/test results, encounter notes, upcoming appointments, etc.  Non-urgent messages can be sent to your provider as well.   To learn more about what you can do with MyChart, go to [email or web address].    Your next appointment:   4 month(s)  Provider:   You may see Debbe Odea, MD or one of the following Advanced Practice Providers on your designated Care Team:   Nicolasa Ducking, NP Eula Listen, PA-C Cadence Fransico Michael, PA-C Charlsie Quest, NP Carlos Levering, NP   Signed, Debbe Odea, MD  05/26/2023 9:42 AM    [facility]

## 2023-06-24 ENCOUNTER — Other Ambulatory Visit: Payer: Self-pay

## 2023-06-24 DIAGNOSIS — I10 Essential (primary) hypertension: Secondary | ICD-10-CM

## 2023-06-24 MED ORDER — AMLODIPINE BESYLATE 10 MG PO TABS: 10.0000 mg | ORAL_TABLET | Freq: Every day | ORAL | 6 refills | Status: DC

## 2023-07-07 ENCOUNTER — Encounter: Payer: Self-pay | Admitting: Family

## 2023-07-07 ENCOUNTER — Ambulatory Visit
Admission: RE | Admit: 2023-07-07 | Discharge: 2023-07-07 | Disposition: A | Discharge status: Home / Self Care | Source: Ambulatory Visit | Attending: Family

## 2023-07-07 ENCOUNTER — Ambulatory Visit: Admitting: Family

## 2023-07-07 VITALS — BP 140/94 | HR 69 | Temp 98.1°F | Ht 69.0 in | Wt 206.4 lb

## 2023-07-07 DIAGNOSIS — M62838 Other muscle spasm: Secondary | ICD-10-CM | POA: Diagnosis not present

## 2023-07-07 DIAGNOSIS — M25512 Pain in left shoulder: Secondary | ICD-10-CM

## 2023-07-07 DIAGNOSIS — N522 Drug-induced erectile dysfunction: Secondary | ICD-10-CM | POA: Diagnosis not present

## 2023-07-07 DIAGNOSIS — R222 Localized swelling, mass and lump, trunk: Secondary | ICD-10-CM

## 2023-07-07 MED ORDER — TIZANIDINE HCL 4 MG PO TABS: 2.0000 mg | ORAL_TABLET | Freq: Every evening | ORAL | 0 refills | Status: DC | PRN

## 2023-07-07 MED ORDER — TADALAFIL 5 MG PO TABS: 5.0000 mg | ORAL_TABLET | Freq: Every day | ORAL | 0 refills | Status: DC

## 2023-07-07 NOTE — Assessment & Plan Note (Signed)
 Suspected lipoma  Xray negative although not likely to see this on xray  Consider mri vs u/s will get in touch with radiology and let pt know next steps
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
## 2023-07-08 NOTE — Assessment & Plan Note (Signed)
 Xray left shoulder today  Work on shoulder exercises.  Ibuprofen, tylenol prn  Heat to site prn.  Pt to f/u with Dr. Patsy Lager if no improvement of symptoms

## 2023-07-08 NOTE — Assessment & Plan Note (Signed)
 Reviewed xray thoracic spine, no indication of spinal disturbance.  Mass is superficial appearing on physical exam. Will consider MRI.

## 2023-07-08 NOTE — Assessment & Plan Note (Signed)
 Discussed with pt suspect this is caused by carvedilol  Rx cialis to use as needed Discussed side effects to watch out for.  Can consider urology if ongoing concern.

## 2023-07-11 ENCOUNTER — Other Ambulatory Visit: Payer: Self-pay | Admitting: *Deleted

## 2023-07-11 MED ORDER — LOSARTAN POTASSIUM 100 MG PO TABS: 100.0000 mg | ORAL_TABLET | Freq: Every day | ORAL | 3 refills | Status: DC

## 2023-07-15 ENCOUNTER — Encounter: Payer: Self-pay | Admitting: Family

## 2023-07-16 ENCOUNTER — Other Ambulatory Visit: Payer: Self-pay

## 2023-07-16 MED ORDER — CARVEDILOL 6.25 MG PO TABS: 6.2500 mg | ORAL_TABLET | Freq: Two times a day (BID) | ORAL | 0 refills | Status: DC

## 2023-08-05 ENCOUNTER — Other Ambulatory Visit: Payer: Self-pay | Admitting: *Deleted

## 2023-08-05 DIAGNOSIS — N522 Drug-induced erectile dysfunction: Secondary | ICD-10-CM

## 2023-08-05 MED ORDER — TADALAFIL 5 MG PO TABS: 5.0000 mg | ORAL_TABLET | Freq: Every day | ORAL | 0 refills | Status: DC

## 2023-09-04 ENCOUNTER — Other Ambulatory Visit: Payer: Self-pay | Admitting: Family

## 2023-09-04 DIAGNOSIS — M62838 Other muscle spasm: Secondary | ICD-10-CM

## 2023-09-04 MED ORDER — TIZANIDINE HCL 4 MG PO TABS: 2.0000 mg | ORAL_TABLET | Freq: Every evening | ORAL | 0 refills | Status: DC | PRN

## 2023-09-12 ENCOUNTER — Other Ambulatory Visit: Payer: Self-pay | Admitting: *Deleted

## 2023-09-12 DIAGNOSIS — N522 Drug-induced erectile dysfunction: Secondary | ICD-10-CM

## 2023-09-12 MED ORDER — TADALAFIL 5 MG PO TABS: 5.0000 mg | ORAL_TABLET | Freq: Every day | ORAL | 0 refills | Status: AC

## 2023-09-27 NOTE — Progress Notes (Unsigned)
 Cardiology Office Note    Date:  09/29/2023   ID:  Marcus, Holmes 04/11/1970, MRN [id number]  PCP:  Corwin Antu, FNP  Cardiologist:  Redell Cave, MD  Electrophysiologist:  None   Chief Complaint: Follow up  History of Present Illness:   Marcus Holmes is a 53 y.o. male with history of HTN, ongoing tobacco use, and RBBB who presents for follow-up of HTN.   He was evaluated by Dr. Cave as a new patient in 08/2021 at the request of his PCP for hypertension and right bundle branch block.  At that time, it was noted he had been evaluated preoperatively for an umbilical hernia repair with blood pressure noted to be elevated in the 200s.  He was started on amlodipine , which was subsequently titrated and losartan  was added with noted improvement in BP readings.  Echo in 07/2022 showed an EF of 60 to 65%, no regional wall motion abnormalities, mild LVH, grade 1 diastolic dysfunction, normal RV systolic function and ventricular cavity size, mildly dilated left atrium, no significant valvular abnormality, borderline dilatation of the aortic root measuring 38 mm, and an estimated right atrial pressure of 3 mmHg.  He was last seen in the office in 05/2023 with BP at 142/72 and recommendation to continue losartan  100 mg, amlodipine  10 mg, and carvedilol  6.25 mg twice daily with plans to follow-up today to reassess BP.  He comes in doing very well from a cardiac perspective and is without symptoms of angina or cardiac decompensation.  No palpitations, dizziness, presyncope, or syncope.  No falls or symptoms concerning for bleeding.  Frequently forgets to take the morning dose of his carvedilol , otherwise adherent to evening dose of carvedilol , amlodipine , and losartan  in the evening hours.  Continues to smoke, though has decreased to 1/4-1/3 of a pack per day.  Has decreased alcohol use to typically 1 beer per day.  Not ready to quit tobacco or alcohol at this  time.  May be needing umbilical hernia repair down the road.   [facility] Activity Status Index: Greater than 4 METs without cardiac limitation Revised Cardiac Risk Index: Low risk for noncardiac surgery   Labs independently reviewed: 10/2021 - potassium 4.1, BUN 15, serum creatinine 1.06 07/2021 - A1c 6.3, Hgb 14.8, PLT 286, TC 108, TG 140, HDL 34, LDL 46, albumin 4.3, AST/ALT normal  Past Medical History:  Diagnosis Date   Essential hypertension     Past Surgical History:  Procedure Laterality Date   TENDON REPAIR Left    hand-injury as a child   WISDOM TOOTH EXTRACTION Bilateral     Current Medications: Current Meds  Medication Sig   amLODipine  (NORVASC ) 10 MG tablet Take 1 tablet (10 mg total) by mouth daily.   carvedilol  (COREG ) 6.25 MG tablet Take 1 tablet (6.25 mg total) by mouth 2 (two) times daily with a meal.   losartan  (COZAAR ) 100 MG tablet Take 1 tablet (100 mg total) by mouth daily.   tadalafil  (CIALIS ) 5 MG tablet Take 1 tablet (5 mg total) by mouth daily. Take thirty minutes prior to sexual intercourse   tiZANidine  (ZANAFLEX ) 4 MG tablet Take 0.5 tablets (2 mg total) by mouth at bedtime as needed for muscle spasms.    Allergies:   Patient has no known allergies.   Social History   Socioeconomic History   Marital status: Married    Spouse name: Marcus Holmes   Number of children: 0   Years of education: Not on file  Highest education level: Not on file  Occupational History   Occupation: Personal assistant: [employer]  Tobacco Use   Smoking status: Every Day    Current packs/day: 0.25    Average packs/day: 0.3 packs/day for 35.0 years (8.8 ttl pk-yrs)    Types: Cigarettes   Smokeless tobacco: Never  Vaping Use   Vaping status: Never Used  Substance and Sexual Activity   Alcohol use: Yes    Comment: occasion   Drug use: Never   Sexual activity: Yes    Partners: Female    Birth control/protection: None  Other Topics Concern   Not on file  Social History  Narrative   Not on file   Social Drivers of Health   Financial Resource Strain: Not on file  Food Insecurity: Not on file  Transportation Needs: Not on file  Physical Activity: Not on file  Stress: Not on file  Social Connections: Not on file     Family History:  The patient's family history includes Healthy in his brother and mother; Heart attack (age of onset: 64) in his father; Heart disease in his paternal grandfather; Lung cancer in his father.  ROS:   12-point review of systems is negative unless otherwise noted in the HPI.   EKGs/Labs/Other Studies Reviewed:    Studies reviewed were summarized above. The additional studies were reviewed today:  2D echo 07/23/2022: 1. Left ventricular ejection fraction, by estimation, is 60 to 65%. The  left ventricle has normal function. The left ventricle has no regional  wall motion abnormalities. There is mild left ventricular hypertrophy.  Left ventricular diastolic parameters  are consistent with Grade I diastolic dysfunction (impaired relaxation).  The average left ventricular global longitudinal strain is -19.4 %.   2. Right ventricular systolic function is normal. The right ventricular  size is normal. Tricuspid regurgitation signal is inadequate for assessing  PA pressure.   3. Left atrial size was mildly dilated.   4. The mitral valve is normal in structure. No evidence of mitral valve  regurgitation. No evidence of mitral stenosis.   5. The aortic valve is tricuspid. Aortic valve regurgitation is not  visualized. No aortic stenosis is present.   6. There is borderline dilatation of the aortic root, measuring 38 mm.   7. The inferior vena cava is normal in size with greater than 50%  respiratory variability, suggesting right atrial pressure of 3 mmHg.    EKG:  EKG is not ordered today.    Recent Labs: No results found for requested labs within last 365 days.  Recent Lipid Panel    Component Value Date/Time   CHOL 108  07/23/2021 0857   TRIG 140.0 07/23/2021 0857   HDL 34.00 (L) 07/23/2021 0857   CHOLHDL 3 07/23/2021 0857   VLDL 28.0 07/23/2021 0857   LDLCALC 46 07/23/2021 0857    PHYSICAL EXAM:    VS:  BP 122/78 (BP Location: Left Arm, Patient Position: Sitting, Cuff Size: Normal)   Pulse 62   Ht 5' 9 (1.753 m)   Wt 198 lb 4 oz (89.9 kg)   SpO2 98%   BMI 29.28 kg/m   BMI: Body mass index is 29.28 kg/m.  Physical Exam Vitals reviewed.  Constitutional:      Appearance: He is well-developed.  HENT:     Head: Normocephalic and atraumatic.   Eyes:     General:        Right eye: No discharge.  Left eye: No discharge.    Cardiovascular:     Rate and Rhythm: Normal rate and regular rhythm.     Heart sounds: Normal heart sounds, S1 normal and S2 normal. Heart sounds not distant. No midsystolic click and no opening snap. No murmur heard.    No friction rub.  Pulmonary:     Effort: Pulmonary effort is normal. No respiratory distress.     Breath sounds: Normal breath sounds. No decreased breath sounds, wheezing, rhonchi or rales.  Chest:     Chest wall: No tenderness.   Musculoskeletal:     Cervical back: Normal range of motion.   Skin:    General: Skin is warm and dry.     Nails: There is no clubbing.   Neurological:     Mental Status: He is alert and oriented to person, place, and time.   Psychiatric:        Speech: Speech normal.        Behavior: Behavior normal.        Thought Content: Thought content normal.        Judgment: Judgment normal.     Wt Readings from Last 3 Encounters:  09/29/23 198 lb 4 oz (89.9 kg)  07/07/23 206 lb 6.4 oz (93.6 kg)  05/26/23 204 lb 12.8 oz (92.9 kg)     ASSESSMENT & PLAN:   HTN: Blood pressure is well-controlled in the office today.  He will work on being more adherent to morning doses of carvedilol .  At this time continue amlodipine  10 mg daily, carvedilol  6.25 mg twice daily, and losartan  100 mg daily.  Low-sodium diet encouraged.   Check BMP.  RBBB: Without significant structural abnormality on echo.  Tobacco/alcohol use: Not yet ready to quit.  Complete cessation recommended.  Preoperative cardiac risk stratification: He may be needing umbilical hernia repair down the road.  He is without symptoms of angina or cardiac decompensation and can achieve greater than 4 METs without cardiac limitation.  He would be low risk for noncardiac surgery.     Disposition: F/u with Dr. Darliss or an APP in 12 months.   Medication Adjustments/Labs and Tests Ordered: Current medicines are reviewed at length with the patient today.  Concerns regarding medicines are outlined above. Medication changes, Labs and Tests ordered today are summarized above and listed in the Patient Instructions accessible in Encounters.   Signed, Bernardino Bring, PA-C 09/29/2023 9:45 AM     [facility] [place] [address] [place], [place] [address] [phone]

## 2023-09-29 ENCOUNTER — Encounter: Payer: Self-pay | Admitting: Physician Assistant

## 2023-09-29 ENCOUNTER — Ambulatory Visit: Payer: BC Managed Care – PPO | Attending: Physician Assistant | Admitting: Physician Assistant

## 2023-09-29 VITALS — BP 122/78 | HR 62 | Ht 69.0 in | Wt 198.2 lb

## 2023-09-29 DIAGNOSIS — I4510 Unspecified right bundle-branch block: Secondary | ICD-10-CM

## 2023-09-29 DIAGNOSIS — Z01810 Encounter for preprocedural cardiovascular examination: Secondary | ICD-10-CM | POA: Diagnosis not present

## 2023-09-29 DIAGNOSIS — I10 Essential (primary) hypertension: Secondary | ICD-10-CM | POA: Diagnosis not present

## 2023-09-29 DIAGNOSIS — Z720 Tobacco use: Secondary | ICD-10-CM

## 2023-09-29 DIAGNOSIS — Z79899 Other long term (current) drug therapy: Secondary | ICD-10-CM

## 2023-09-29 NOTE — Patient Instructions (Signed)
 Medication Instructions:  Your physician recommends that you continue on your current medications as directed. Please refer to the Current Medication list given to you today.   *If you need a refill on your cardiac medications before your next appointment, please call your pharmacy*  Lab Work: Your provider would like for you to have following labs drawn today BMeT.   If you have labs (blood work) drawn today and your tests are completely normal, you will receive your results only by: MyChart Message (if you have MyChart) OR A paper copy in the mail If you have any lab test that is abnormal or we need to change your treatment, we will call you to review the results.  Follow-Up: At [facility], you and your health needs are our priority.  As part of our continuing mission to provide you with exceptional heart care, our providers are all part of one team.  This team includes your primary Cardiologist (physician) and Advanced Practice Providers or APPs (Physician Assistants and Nurse Practitioners) who all work together to provide you with the care you need, when you need it.  Your next appointment:   1 year(s)  Provider:   You may see Redell Cave, MD or Bernardino Bring, PA-C

## 2023-09-30 ENCOUNTER — Ambulatory Visit: Payer: Self-pay | Admitting: Physician Assistant

## 2023-09-30 LAB — BASIC METABOLIC PANEL WITH GFR
BUN/Creatinine Ratio: 14 (ref 9–20)
BUN: 13 mg/dL (ref 6–24)
CO2: 21 mmol/L (ref 20–29)
Calcium: 9.4 mg/dL (ref 8.7–10.2)
Chloride: 105 mmol/L (ref 96–106)
Creatinine, Ser: 0.91 mg/dL (ref 0.76–1.27)
Glucose: 144 mg/dL — ABNORMAL HIGH (ref 70–99)
Potassium: 4.3 mmol/L (ref 3.5–5.2)
Sodium: 141 mmol/L (ref 134–144)
eGFR: 101 mL/min/{1.73_m2} (ref >59)

## 2023-10-09 IMAGING — DX DG THORACIC SPINE 3V
2 series · 2 of 2 positions shown · non-contrast
Comparison: None Available.

CLINICAL DATA: Midthoracic spine mass.

EXAM:
THORACIC SPINE - 3 VIEWS

[thoracic spine ap]
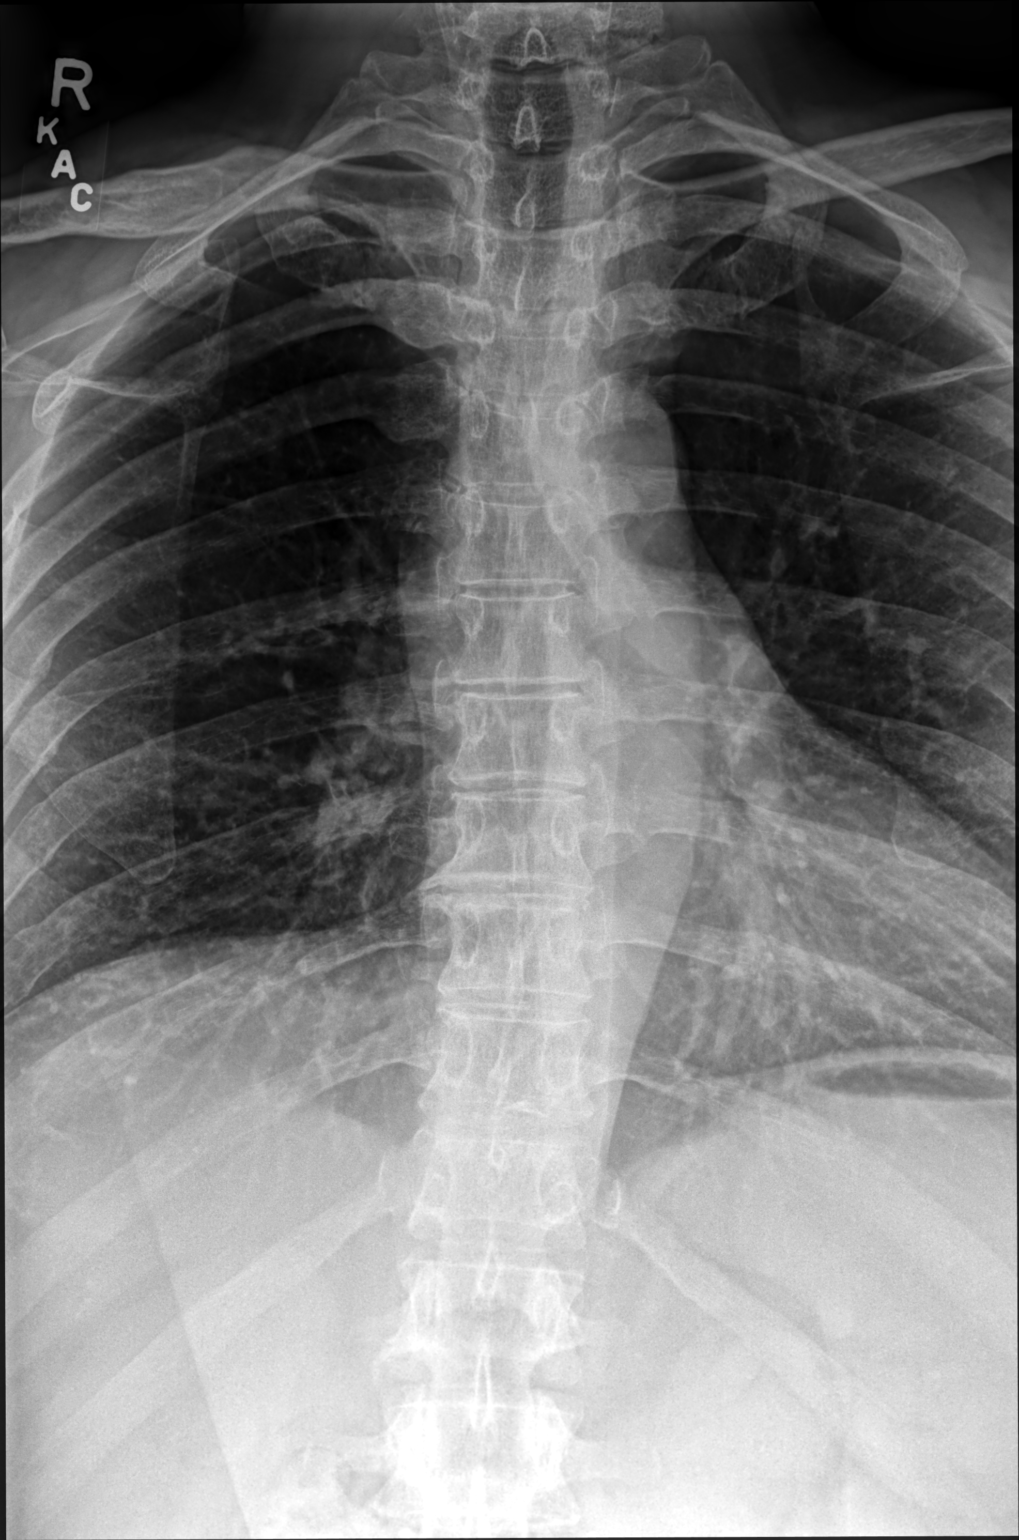

[thoracic spine lateral position lat]
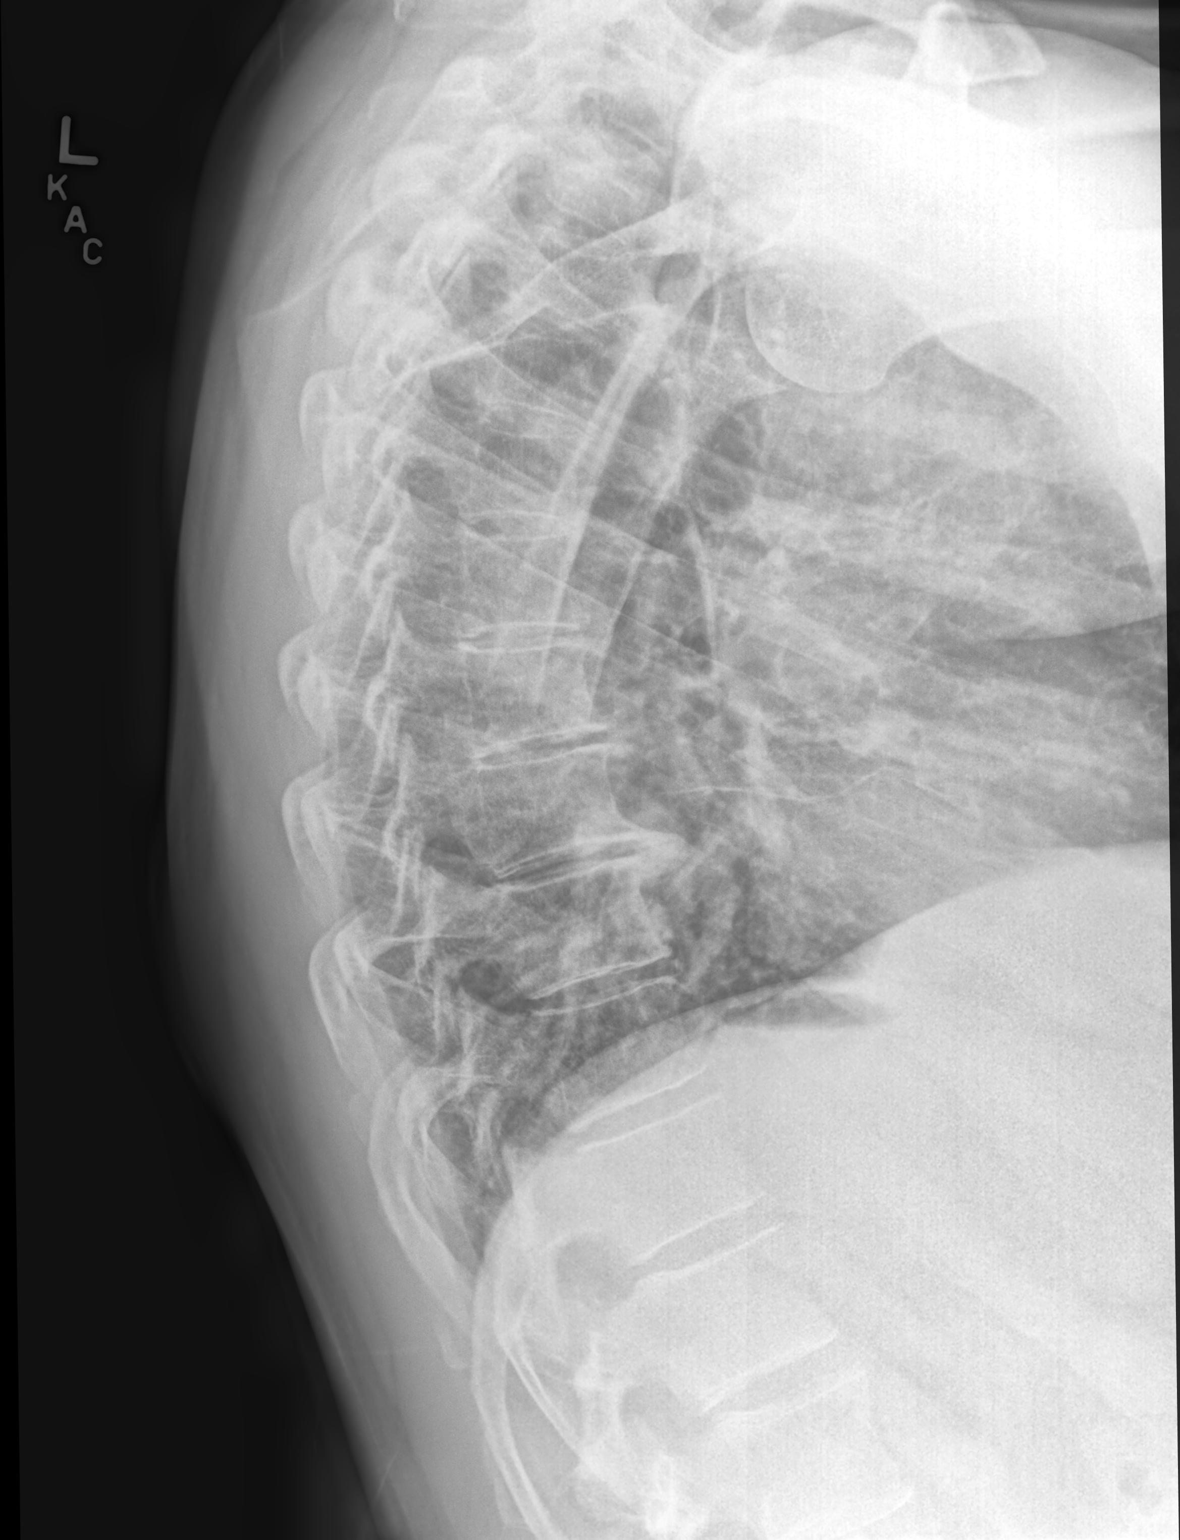

[2 of 2 positions shown; findings below may reference images not displayed]

FINDINGS: There is no acute fracture or subluxation of the thoracic spine.
Degenerative changes with spurring of the midthoracic spine. The
soft tissues are unremarkable.
IMPRESSION: No acute findings.

## 2023-10-15 ENCOUNTER — Other Ambulatory Visit: Payer: Self-pay | Admitting: Cardiology

## 2023-11-03 ENCOUNTER — Other Ambulatory Visit: Payer: Self-pay | Admitting: Family

## 2023-11-03 DIAGNOSIS — M62838 Other muscle spasm: Secondary | ICD-10-CM

## 2023-11-04 MED ORDER — TIZANIDINE HCL 4 MG PO TABS: 2.0000 mg | ORAL_TABLET | Freq: Every evening | ORAL | 0 refills | Status: DC | PRN

## 2023-11-18 ENCOUNTER — Other Ambulatory Visit: Payer: Self-pay | Admitting: Cardiology

## 2023-12-25 ENCOUNTER — Other Ambulatory Visit: Payer: Self-pay

## 2023-12-25 ENCOUNTER — Encounter: Payer: Self-pay | Admitting: Pharmacist

## 2024-01-05 ENCOUNTER — Other Ambulatory Visit: Payer: Self-pay | Admitting: Family

## 2024-01-05 DIAGNOSIS — M62838 Other muscle spasm: Secondary | ICD-10-CM

## 2024-01-09 ENCOUNTER — Other Ambulatory Visit: Payer: Self-pay

## 2024-01-09 ENCOUNTER — Other Ambulatory Visit (HOSPITAL_COMMUNITY): Payer: Self-pay

## 2024-01-09 MED ORDER — TIZANIDINE HCL 4 MG PO TABS
2.0000 mg | ORAL_TABLET | Freq: Every evening | ORAL | 0 refills | Status: AC | PRN
  Filled 2024-01-09: qty 30, 60d supply, fill #0

## 2024-02-17 ENCOUNTER — Other Ambulatory Visit: Payer: Self-pay | Admitting: Cardiology

## 2024-02-17 DIAGNOSIS — I10 Essential (primary) hypertension: Secondary | ICD-10-CM

## 2024-02-20 MED ORDER — AMLODIPINE BESYLATE 10 MG PO TABS: 10.0000 mg | ORAL_TABLET | Freq: Every day | ORAL | 7 refills | Status: AC
